# Patient Record
Sex: Female | Born: 1965 | Race: White | Hispanic: No | Smoking: Current every day smoker
Health system: Southern US, Community
[De-identification: ages and names within clinical notes are randomized; demographics above are authoritative.]

## PROBLEM LIST (undated history)

## (undated) DIAGNOSIS — N390 Urinary tract infection, site not specified: Secondary | ICD-10-CM

---

## 1997-12-22 ENCOUNTER — Emergency Department (HOSPITAL_COMMUNITY): Admission: EM | Admit: 1997-12-22 | Discharge: 1997-12-22 | Payer: Self-pay | Admitting: Emergency Medicine

## 1997-12-26 ENCOUNTER — Emergency Department (HOSPITAL_COMMUNITY): Admission: EM | Admit: 1997-12-26 | Discharge: 1997-12-26 | Payer: Self-pay | Admitting: Emergency Medicine

## 1998-04-21 ENCOUNTER — Emergency Department (HOSPITAL_COMMUNITY): Admission: EM | Admit: 1998-04-21 | Discharge: 1998-04-21 | Payer: Self-pay | Admitting: Emergency Medicine

## 1998-12-20 ENCOUNTER — Other Ambulatory Visit: Admission: RE | Admit: 1998-12-20 | Discharge: 1998-12-20 | Payer: Self-pay | Admitting: Orthopedic Surgery

## 2000-08-20 ENCOUNTER — Encounter: Payer: Self-pay | Admitting: Family Medicine

## 2000-08-20 ENCOUNTER — Encounter: Admission: RE | Admit: 2000-08-20 | Discharge: 2000-08-20 | Payer: Self-pay | Admitting: Family Medicine

## 2000-09-06 ENCOUNTER — Encounter: Admission: RE | Admit: 2000-09-06 | Discharge: 2000-09-06 | Payer: Self-pay | Admitting: Family Medicine

## 2000-09-21 ENCOUNTER — Encounter: Payer: Self-pay | Admitting: Family Medicine

## 2000-09-21 ENCOUNTER — Encounter: Admission: RE | Admit: 2000-09-21 | Discharge: 2000-09-21 | Payer: Self-pay | Admitting: Family Medicine

## 2001-03-23 ENCOUNTER — Inpatient Hospital Stay (HOSPITAL_COMMUNITY): Admission: AD | Admit: 2001-03-23 | Discharge: 2001-03-23 | Payer: Self-pay | Admitting: *Deleted

## 2001-03-23 ENCOUNTER — Encounter: Payer: Self-pay | Admitting: Obstetrics and Gynecology

## 2001-03-25 ENCOUNTER — Inpatient Hospital Stay (HOSPITAL_COMMUNITY): Admission: AD | Admit: 2001-03-25 | Discharge: 2001-03-25 | Payer: Self-pay | Admitting: Obstetrics and Gynecology

## 2001-06-26 ENCOUNTER — Ambulatory Visit (HOSPITAL_COMMUNITY): Admission: RE | Admit: 2001-06-26 | Discharge: 2001-06-26 | Payer: Self-pay | Admitting: Obstetrics and Gynecology

## 2001-06-26 ENCOUNTER — Encounter: Payer: Self-pay | Admitting: Obstetrics and Gynecology

## 2001-09-07 ENCOUNTER — Inpatient Hospital Stay (HOSPITAL_COMMUNITY): Admission: AD | Admit: 2001-09-07 | Discharge: 2001-09-07 | Payer: Self-pay | Admitting: Obstetrics and Gynecology

## 2001-09-09 ENCOUNTER — Inpatient Hospital Stay (HOSPITAL_COMMUNITY): Admission: AD | Admit: 2001-09-09 | Discharge: 2001-09-09 | Payer: Self-pay | Admitting: Obstetrics and Gynecology

## 2001-09-11 ENCOUNTER — Inpatient Hospital Stay (HOSPITAL_COMMUNITY): Admission: AD | Admit: 2001-09-11 | Discharge: 2001-09-11 | Payer: Self-pay | Admitting: Obstetrics and Gynecology

## 2001-10-04 ENCOUNTER — Inpatient Hospital Stay (HOSPITAL_COMMUNITY): Admission: AD | Admit: 2001-10-04 | Discharge: 2001-10-04 | Payer: Self-pay | Admitting: Obstetrics and Gynecology

## 2001-10-05 ENCOUNTER — Inpatient Hospital Stay (HOSPITAL_COMMUNITY): Admission: AD | Admit: 2001-10-05 | Discharge: 2001-10-05 | Payer: Self-pay | Admitting: Obstetrics and Gynecology

## 2001-10-07 ENCOUNTER — Inpatient Hospital Stay (HOSPITAL_COMMUNITY): Admission: AD | Admit: 2001-10-07 | Discharge: 2001-10-07 | Payer: Self-pay | Admitting: Obstetrics and Gynecology

## 2001-11-18 ENCOUNTER — Inpatient Hospital Stay (HOSPITAL_COMMUNITY): Admission: AD | Admit: 2001-11-18 | Discharge: 2001-11-20 | Payer: Self-pay | Admitting: Obstetrics and Gynecology

## 2001-11-21 ENCOUNTER — Encounter: Admission: RE | Admit: 2001-11-21 | Discharge: 2001-12-21 | Payer: Self-pay | Admitting: Obstetrics and Gynecology

## 2001-12-26 ENCOUNTER — Ambulatory Visit (HOSPITAL_COMMUNITY): Admission: RE | Admit: 2001-12-26 | Discharge: 2001-12-26 | Payer: Self-pay | Admitting: Obstetrics and Gynecology

## 2002-01-21 ENCOUNTER — Encounter: Admission: RE | Admit: 2002-01-21 | Discharge: 2002-02-20 | Payer: Self-pay | Admitting: Obstetrics and Gynecology

## 2002-02-21 ENCOUNTER — Encounter: Admission: RE | Admit: 2002-02-21 | Discharge: 2002-03-23 | Payer: Self-pay | Admitting: Obstetrics and Gynecology

## 2002-04-23 ENCOUNTER — Encounter: Admission: RE | Admit: 2002-04-23 | Discharge: 2002-05-23 | Payer: Self-pay | Admitting: Obstetrics and Gynecology

## 2002-06-23 ENCOUNTER — Encounter: Admission: RE | Admit: 2002-06-23 | Discharge: 2002-07-23 | Payer: Self-pay | Admitting: Obstetrics and Gynecology

## 2002-07-24 ENCOUNTER — Encounter: Admission: RE | Admit: 2002-07-24 | Discharge: 2002-08-23 | Payer: Self-pay | Admitting: Obstetrics and Gynecology

## 2002-09-22 ENCOUNTER — Encounter: Admission: RE | Admit: 2002-09-22 | Discharge: 2002-10-22 | Payer: Self-pay | Admitting: Obstetrics and Gynecology

## 2002-11-22 ENCOUNTER — Encounter: Admission: RE | Admit: 2002-11-22 | Discharge: 2002-12-22 | Payer: Self-pay | Admitting: Obstetrics and Gynecology

## 2003-12-12 ENCOUNTER — Emergency Department (HOSPITAL_COMMUNITY): Admission: EM | Admit: 2003-12-12 | Discharge: 2003-12-12 | Payer: Self-pay | Admitting: Emergency Medicine

## 2005-01-10 ENCOUNTER — Ambulatory Visit: Payer: Self-pay | Admitting: Gastroenterology

## 2005-01-30 ENCOUNTER — Ambulatory Visit: Payer: Self-pay | Admitting: Gastroenterology

## 2005-02-27 ENCOUNTER — Ambulatory Visit: Payer: Self-pay | Admitting: Gastroenterology

## 2005-03-06 ENCOUNTER — Ambulatory Visit (HOSPITAL_COMMUNITY): Admission: RE | Admit: 2005-03-06 | Discharge: 2005-03-06 | Payer: Self-pay | Admitting: Gastroenterology

## 2005-03-13 ENCOUNTER — Ambulatory Visit (HOSPITAL_COMMUNITY): Admission: RE | Admit: 2005-03-13 | Discharge: 2005-03-13 | Payer: Self-pay | Admitting: Gastroenterology

## 2006-12-31 ENCOUNTER — Ambulatory Visit: Payer: Self-pay | Admitting: Gastroenterology

## 2007-06-18 ENCOUNTER — Emergency Department (HOSPITAL_COMMUNITY): Admission: EM | Admit: 2007-06-18 | Discharge: 2007-06-18 | Payer: Self-pay | Admitting: Family Medicine

## 2007-09-17 DIAGNOSIS — F32A Depression, unspecified: Secondary | ICD-10-CM | POA: Insufficient documentation

## 2007-09-17 DIAGNOSIS — R1013 Epigastric pain: Secondary | ICD-10-CM | POA: Insufficient documentation

## 2007-09-17 DIAGNOSIS — K9289 Other specified diseases of the digestive system: Secondary | ICD-10-CM | POA: Insufficient documentation

## 2007-09-17 DIAGNOSIS — F329 Major depressive disorder, single episode, unspecified: Secondary | ICD-10-CM

## 2007-09-17 DIAGNOSIS — F411 Generalized anxiety disorder: Secondary | ICD-10-CM | POA: Insufficient documentation

## 2007-09-17 DIAGNOSIS — K3189 Other diseases of stomach and duodenum: Secondary | ICD-10-CM

## 2007-09-17 DIAGNOSIS — F410 Panic disorder [episodic paroxysmal anxiety] without agoraphobia: Secondary | ICD-10-CM | POA: Insufficient documentation

## 2008-04-21 ENCOUNTER — Emergency Department (HOSPITAL_COMMUNITY): Admission: EM | Admit: 2008-04-21 | Discharge: 2008-04-21 | Payer: Self-pay | Admitting: Emergency Medicine

## 2008-11-02 ENCOUNTER — Emergency Department (HOSPITAL_COMMUNITY): Admission: EM | Admit: 2008-11-02 | Discharge: 2008-11-02 | Payer: Self-pay | Admitting: Family Medicine

## 2010-09-13 LAB — POCT URINALYSIS DIP (DEVICE)
Hgb urine dipstick: NEGATIVE
Urobilinogen, UA: 0.2 mg/dL (ref 0.0–1.0)

## 2010-09-13 LAB — URINE CULTURE

## 2010-10-18 NOTE — Assessment & Plan Note (Signed)
[facility]                         GASTROENTEROLOGY OFFICE NOTE   NAME:BRYANTShamecca, Whitebread                       MRN:          [id number]  DATE:12/31/2006                            DOB:          1965-08-13    Mrs. Hines returns on referral from [facility] for  worsening problems with periumbilical abdominal discomfort, belching,  nausea and alternating diarrhea and constipation.  Her symptoms are  often precipitated by meals.  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  Her prior workup  included an abdominal ultrasound, a gastric emptying scan and an upper  endoscopy and all were normal.  She recently had a repeat abdominal  ultrasound at [facility] on June 26, which was normal.  A  CBC, basic metabolic panel, liver function tests, TSH, amylase and  lipase were all normal on November 26, 2006.  She states she has been under  significant stress and she has ongoing problems with anxiety and panic  attacks.  She notes her gastrointestinal symptoms clearly worsen when  she is under more stress.  She has had some relief of her nausea with  Promethazine.  She does not feel that Nexium has been effective for any  of her symptoms.   CURRENT MEDICATIONS:  Listed on the chart, updated and reviewed.   MEDICATION ALLERGIES:  AMOXICILLIN.   PHYSICAL EXAMINATION:  In no acute distress.  Weight 137.2 pounds.  Blood pressure is 104/60, pulse 80 and regular.  CHEST:  Clear to auscultation bilaterally.  CARDIAC:  Regular rate and rhythm without murmurs.  ABDOMEN:  Soft with minimal periumbilical tenderness to deep palpation,  but no rebound or guarding, no palpable organomegaly, masses or hernias,  normoactive bowel sounds.   ASSESSMENT AND PLAN:  Suspected irritable bowel syndrome and functional  dyspepsia.  She may have a component of aerophagia leading to increased  belching.  She will return  to [facility] for further management of  her anxiety, which appears to be triggering the majority of her  gastrointestinal complaints. Begin Robinul Forte one p.o. b.i.d.  Continue Nexium 40 mg p.o. q.a.m. and Promethazine one-half tablet q.6  h. p.r.n. nausea.     Venita Lick. Russella Dar, MD, [facility]  Electronically Signed    MTS/MedQ  DD: 12/31/2006  DT: 01/01/2007  Job #: [id number]   cc:   [facility], [address] [place]  [place], [place], [address]
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
## 2010-10-21 NOTE — Op Note (Signed)
[facility]  Patient:    Ana Morgan, Ana Morgan Visit Number: [id number] MRN: [id number]          Service Type: DSU Location: [facility] Attending Physician:  Leonard Schwartz Dictated by:   Janine Limbo, M.D. Proc. Date: 12/26/01 Admit Date:  12/26/2001 Discharge Date: 12/26/2001                             Operative Report  PREOPERATIVE DIAGNOSES:       Desires sterilization.  POSTOPERATIVE DIAGNOSES:      1. Desires sterilization.                               2. Omental adhesions.  PROCEDURE:                    Laparoscopic tubal cautery.  SURGEON:                      Janine Limbo, M.D.  ANESTHESIA:                   General.  DISPOSITION:                  [name] is a 45 year old female para 2-1-2-3 who desires permanent sterilization.  She understands the indications for her procedure and she accepts the risks of, but not limited to, anesthetic complications, bleeding, infections, and possible damage to the surrounding organs.  FINDINGS:                     The uterus, fallopian tubes, and the ovaries were normal.  There were mild adhesions between the omentum and the anterior uterus.  The liver appeared normal.  The bowel appeared normal.  The appendix was normal except that there were some filmy adhesions between the proximal appendix and the right pelvic side wall.  PROCEDURE:                    The patient was taken to the operating room where a general anesthetic was given.  The patients abdomen, perineum, and vagina were prepped with multiple layers of Betadine.  Examination under anesthesia was performed.  The bladder was drained of urine.  A Hulka tenaculum was placed inside the uterus.  The patient was sterilely draped. The subumbilical area was injected with 5 cc of 0.5% Marcaine with epinephrine.  An incision was made and the Veress needle was inserted.  Proper placement was confirmed using the saline drop test.   A pneumoperitoneum was then obtained.  The laparoscopic trocar and then the laparoscope were substituted for the Veress needle.  The pelvic structures were visualized with findings as mentioned above.  The left fallopian tube was identified and followed to its fimbriated end.  The proximal portion of the left fallopian tube was cauterized in several segments using electrocautery.  An identical procedure was carried out on the opposite side.  Hemostasis was adequate bilaterally.  The bowel was carefully inspected and there was no evidence of trocar damage.  The pneumoperitoneum was allowed to escape.  The instruments were removed.  The incision was closed using deep and superficial sutures of 3-0 Vicryl.  Sponge, needle, and instrument counts were correct on two occasions.  The estimated blood loss was less than 5 cc.  The patient tolerated her  procedure well.  She was taken to the recovery room in stable condition.  FOLLOWUP INSTRUCTIONS:        The patient was given a prescription for Vicodin and she will take one to two q.4h. as needed for pain.  She will call for questions or concerns.  She was given a copy of the postoperative instruction sheet as prepared by the [facility] for patients who have undergone laparoscopy.  She will have a return visit in two to three weeks. Dictated by:   Janine Limbo, M.D. Attending Physician:  Leonard Schwartz DD:  12/26/01 TD:  12/30/01 Job: [id number]

## 2011-09-28 ENCOUNTER — Emergency Department (HOSPITAL_COMMUNITY): Payer: Self-pay

## 2011-09-28 ENCOUNTER — Emergency Department (HOSPITAL_COMMUNITY)
Admission: EM | Admit: 2011-09-28 | Discharge: 2011-09-28 | Disposition: A | Discharge status: Home / Self Care | Payer: Self-pay | Attending: Emergency Medicine | Admitting: Emergency Medicine

## 2011-09-28 ENCOUNTER — Encounter (HOSPITAL_COMMUNITY): Payer: Self-pay | Admitting: Emergency Medicine

## 2011-09-28 DIAGNOSIS — R079 Chest pain, unspecified: Secondary | ICD-10-CM | POA: Insufficient documentation

## 2011-09-28 DIAGNOSIS — S20219A Contusion of unspecified front wall of thorax, initial encounter: Secondary | ICD-10-CM | POA: Insufficient documentation

## 2011-09-28 DIAGNOSIS — X58XXXA Exposure to other specified factors, initial encounter: Secondary | ICD-10-CM | POA: Insufficient documentation

## 2011-09-28 DIAGNOSIS — F17200 Nicotine dependence, unspecified, uncomplicated: Secondary | ICD-10-CM | POA: Insufficient documentation

## 2011-09-28 MED ORDER — BACLOFEN 10 MG PO TABS: 10.0000 mg | ORAL_TABLET | Freq: Three times a day (TID) | ORAL | Status: DC

## 2011-09-28 MED ORDER — MELOXICAM 7.5 MG PO TABS: ORAL_TABLET | ORAL | Status: DC

## 2011-09-28 MED ORDER — CODEINE SULFATE 30 MG PO TABS: 30.0000 mg | ORAL_TABLET | Freq: Four times a day (QID) | ORAL | Status: AC | PRN

## 2011-09-28 MED ORDER — ONDANSETRON HCL 4 MG PO TABS
4.0000 mg | ORAL_TABLET | Freq: Once | ORAL | Status: DC
  Filled 2011-09-28: qty 1

## 2011-09-28 MED ORDER — KETOROLAC TROMETHAMINE 10 MG PO TABS
10.0000 mg | ORAL_TABLET | Freq: Once | ORAL | Status: DC
  Filled 2011-09-28: qty 1

## 2011-09-28 MED ORDER — CYCLOBENZAPRINE HCL 10 MG PO TABS
10.0000 mg | ORAL_TABLET | Freq: Once | ORAL | Status: DC
  Filled 2011-09-28: qty 1
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
## 2011-09-28 NOTE — ED Notes (Signed)
Pt DC to home with steady gait 

## 2011-09-30 NOTE — ED Provider Notes (Signed)
Medical screening examination/treatment/procedure(s) were performed by non-physician practitioner and as supervising physician I was immediately available for consultation/collaboration.  Talana Slatten, MD 09/30/11 1143 

## 2011-10-22 ENCOUNTER — Emergency Department (HOSPITAL_COMMUNITY): Payer: Self-pay

## 2011-10-22 ENCOUNTER — Emergency Department (HOSPITAL_COMMUNITY)
Admission: EM | Admit: 2011-10-22 | Discharge: 2011-10-22 | Disposition: A | Discharge status: Home / Self Care | Payer: Self-pay | Attending: Emergency Medicine | Admitting: Emergency Medicine

## 2011-10-22 ENCOUNTER — Encounter (HOSPITAL_COMMUNITY): Payer: Self-pay

## 2011-10-22 DIAGNOSIS — Y92009 Unspecified place in unspecified non-institutional (private) residence as the place of occurrence of the external cause: Secondary | ICD-10-CM | POA: Insufficient documentation

## 2011-10-22 DIAGNOSIS — S91309A Unspecified open wound, unspecified foot, initial encounter: Secondary | ICD-10-CM | POA: Insufficient documentation

## 2011-10-22 DIAGNOSIS — W269XXA Contact with unspecified sharp object(s), initial encounter: Secondary | ICD-10-CM | POA: Insufficient documentation

## 2011-10-22 DIAGNOSIS — T148XXA Other injury of unspecified body region, initial encounter: Secondary | ICD-10-CM

## 2011-10-22 MED ORDER — CEPHALEXIN 500 MG PO CAPS: 500.0000 mg | ORAL_CAPSULE | Freq: Four times a day (QID) | ORAL | Status: AC

## 2011-10-22 MED ORDER — TRAMADOL HCL 50 MG PO TABS: 50.0000 mg | ORAL_TABLET | Freq: Four times a day (QID) | ORAL | Status: AC | PRN
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
## 2011-10-22 NOTE — ED Provider Notes (Signed)
History     CSN: [id number]  Arrival date & time 10/22/11  1839   First MD Initiated Contact with Patient 10/22/11 2041      Chief Complaint  Patient presents with  . Foot Injury    (Consider location/radiation/quality/duration/timing/severity/associated sxs/prior treatment) Patient is a 46 y.o. female presenting with foot injury. The history is provided by the patient.  Foot Injury  The incident occurred more than 2 days ago. The incident occurred at home. Pertinent negatives include no numbness.  Pt states she was walking barefoot through the house, and thinks she stepped on something. Saw a hole in her foot and pain. Did not see anything on the ground she may have stepped on. Has been cleaning it at home, ibuprofen. No improvement. This happened 5 days ago.   History reviewed. No pertinent past medical history.  Past Surgical History  Procedure Date  . Cesarean section     No family history on file.  History  Substance Use Topics  . Smoking status: Current Everyday Smoker -- 1.0 packs/day  . Smokeless tobacco: Not on file  . Alcohol Use: Yes     Occasionally    OB History    Grav Para Term Preterm Abortions TAB SAB Ect Mult Living                  Review of Systems  Constitutional: Negative for fever and chills.  Respiratory: Negative.   Cardiovascular: Negative.   Musculoskeletal: Positive for arthralgias.  Skin: Positive for wound. Negative for color change and rash.  Neurological: Negative for weakness and numbness.    Allergies  Amoxicillin and Hydrocodone  Home Medications   Current Outpatient Rx  Name Route Sig Dispense Refill  . IBUPROFEN 200 MG PO TABS Oral Take 200 mg by mouth every 6 (six) hours as needed. Pain    . VITAMIN B-12 100 MCG PO TABS Oral Take 50 mcg by mouth daily.      BP 117/67  Pulse 75  Temp(Src) 98 F (36.7 C) (Oral)  Resp 18  SpO2 100%  Physical Exam  Nursing note and vitals reviewed. Constitutional: She is  oriented to person, place, and time. She appears well-developed and well-nourished. No distress.  Eyes: Conjunctivae are normal.  Cardiovascular: Normal rate, regular rhythm and normal heart sounds.   Pulmonary/Chest: Effort normal and breath sounds normal. No respiratory distress. She has no wheezes. She has no rales.  Musculoskeletal:       Puncture mark to the right heel. Tender. Does not appear to be swollen, erythemous. No surrounding edema or erythema  Neurological: She is alert and oriented to person, place, and time.  Skin: Skin is warm and dry.  Psychiatric: She has a normal mood and affect.    ED Course  Procedures (including critical care time)    Dg Foot 2 Views Right  10/22/2011  *RADIOLOGY REPORT*  Clinical Data: Fall, heel pain  RIGHT FOOT - 2 VIEW  Comparison: None.  Findings: No fracture  or dislocation of midfoot or forefoot.  The phalanges are normal.  The calcaneus is normal.  No soft tissue abnormality.  A plantar calcaneal spurring.  IMPRESSION: No acute osseous abnormality.  Original Report Authenticated By: Genevive Bi, M.D.    X-ray obtained to rule out radiopaque foreign body and it is negative. There is no current signs of infection but very tender. Will start on antibiotic prophylactically and follow up as needed.    1. Puncture wound  MDM          Lottie Mussel, PA 10/23/11 [phone]

## 2011-10-22 NOTE — Discharge Instructions (Signed)
Your x-ray did not show a foreign body that would show up on an x-ray. There is a possiblity that there may be something in your heel that would not show upon the x-ray. Try taking antibiotic as prescribed utnil all gone. Ibuprofen for pain. Ultram for severe pain. Get some Calluce donuts to apply to that area. Follow up with primary care doctor or foot specialist or orthopedics if not improving.   Puncture Wound A puncture wound is an injury that extends through all layers of the skin and into the tissue beneath the skin (subcutaneous tissue). Puncture wounds become infected easily because germs often enter the body and go beneath the skin during the injury. Having a deep wound with a small entrance point makes it difficult for your caregiver to adequately clean the wound. This is especially true if you have stepped on a nail and it has passed through a dirty shoe or other situations where the wound is obviously contaminated. CAUSES  Many puncture wounds involve glass, nails, splinters, fish hooks, or other objects that enter the skin (foreign bodies). A puncture wound may also be caused by a human bite or animal bite. DIAGNOSIS  A puncture wound is usually diagnosed by your history and a physical exam. You may need to have an X-ray or an ultrasound to check for any foreign bodies still in the wound. TREATMENT   Your caregiver will clean the wound as thoroughly as possible. Depending on the location of the wound, a bandage (dressing) may be applied.   Your caregiver might prescribe antibiotic medicines.   You may need a follow-up visit to check on your wound. Follow all instructions as directed by your caregiver.  HOME CARE INSTRUCTIONS   Change your dressing once per day, or as directed by your caregiver. If the dressing sticks, it may be removed by soaking the area in water.   If your caregiver has given you follow-up instructions, it is very important that you return for a follow-up  appointment. Not following up as directed could result in a chronic or permanent injury, pain, and disability.   Only take over-the-counter or prescription medicines for pain, discomfort, or fever as directed by your caregiver.   If you are given antibiotics, take them as directed. Finish them even if you start to feel better.  You may need a tetanus shot if:  You cannot remember when you had your last tetanus shot.   You have never had a tetanus shot.  If you got a tetanus shot, your arm may swell, get red, and feel warm to the touch. This is common and not a problem. If you need a tetanus shot and you choose not to have one, there is a rare chance of getting tetanus. Sickness from tetanus can be serious. You may need a rabies shot if an animal bite caused your puncture wound. SEEK MEDICAL CARE IF:   You have redness, swelling, or increasing pain in the wound.   You have red streaks going away from the wound.   You notice a bad smell coming from the wound or dressing.   You have yellowish-white fluid (pus) coming from the wound.   You are treated with an antibiotic for infection, but the infection is not getting better.   You notice something in the wound, such as rubber from your shoe, cloth, or another object.   You have a fever.   You have severe pain.   You have difficulty breathing.   You  feel dizzy or faint.   You cannot stop vomiting.   You lose feeling, develop numbness, or cannot move a limb below the wound.   Your symptoms worsen.  MAKE SURE YOU:  Understand these instructions.   Will watch your condition.   Will get help right away if you are not doing well or get worse.  Document Released: 03/01/2005 Document Revised: 05/11/2011 Document Reviewed: 11/08/2010 [facility] Patient Information 2012 [place], [place].

## 2011-10-22 NOTE — ED Notes (Signed)
Pt alert, nad, c/o right foot pain, onset several days ago, pt states "i stepped on something in the bedroom", unknown object, pt has pin sized puncture wound to heal of foot, no drainage, area tender, ambulates to triage

## 2011-10-23 NOTE — ED Provider Notes (Signed)
Medical screening examination/treatment/procedure(s) were performed by non-physician practitioner and as supervising physician I was immediately available for consultation/collaboration.  Tennyson Kallen R Tarryn Bogdan, MD 10/23/11 2337 

## 2012-12-24 ENCOUNTER — Other Ambulatory Visit (HOSPITAL_COMMUNITY): Payer: Self-pay | Admitting: Nurse Practitioner

## 2012-12-24 DIAGNOSIS — Z139 Encounter for screening, unspecified: Secondary | ICD-10-CM

## 2012-12-30 ENCOUNTER — Ambulatory Visit (HOSPITAL_COMMUNITY)
Admission: RE | Admit: 2012-12-30 | Discharge: 2012-12-30 | Disposition: A | Discharge status: Home / Self Care | Payer: Self-pay | Source: Ambulatory Visit | Attending: Nurse Practitioner | Admitting: Nurse Practitioner

## 2012-12-30 DIAGNOSIS — Z139 Encounter for screening, unspecified: Secondary | ICD-10-CM

## 2013-04-03 ENCOUNTER — Encounter (HOSPITAL_COMMUNITY): Payer: Self-pay | Admitting: Emergency Medicine

## 2013-04-03 ENCOUNTER — Emergency Department (HOSPITAL_COMMUNITY)
Admission: EM | Admit: 2013-04-03 | Discharge: 2013-04-03 | Disposition: A | Discharge status: Home / Self Care | Payer: Self-pay | Attending: Emergency Medicine | Admitting: Emergency Medicine

## 2013-04-03 DIAGNOSIS — H9209 Otalgia, unspecified ear: Secondary | ICD-10-CM | POA: Insufficient documentation

## 2013-04-03 DIAGNOSIS — F17200 Nicotine dependence, unspecified, uncomplicated: Secondary | ICD-10-CM | POA: Insufficient documentation

## 2013-04-03 DIAGNOSIS — K120 Recurrent oral aphthae: Secondary | ICD-10-CM | POA: Insufficient documentation

## 2013-04-03 DIAGNOSIS — K029 Dental caries, unspecified: Secondary | ICD-10-CM | POA: Insufficient documentation

## 2013-04-03 DIAGNOSIS — K089 Disorder of teeth and supporting structures, unspecified: Secondary | ICD-10-CM | POA: Insufficient documentation

## 2013-04-03 DIAGNOSIS — Z880 Allergy status to penicillin: Secondary | ICD-10-CM | POA: Insufficient documentation

## 2013-04-03 DIAGNOSIS — R51 Headache: Secondary | ICD-10-CM | POA: Insufficient documentation

## 2013-04-03 MED ORDER — CLINDAMYCIN HCL 150 MG PO CAPS: 450.0000 mg | ORAL_CAPSULE | Freq: Three times a day (TID) | ORAL | Status: DC

## 2013-04-03 MED ORDER — OXYCODONE-ACETAMINOPHEN 5-325 MG PO TABS: 1.0000 | ORAL_TABLET | ORAL | Status: DC | PRN

## 2013-04-03 NOTE — ED Notes (Signed)
Pt. reports dentalgia at left upper molar onset today unrelieved by OTC pain medications .

## 2013-04-03 NOTE — ED Provider Notes (Signed)
CSN: [id number]     Arrival date & time 04/03/13  1940 History  This chart was scribed for non-physician practitioner Dierdre Forth, PA, working with Flint Melter, MD by Ronal Fear, ED scribe. This patient was seen in room TR06C/TR06C and the patient's care was started at 9:19 PM.    Chief Complaint  Patient presents with  . Dental Pain    HPI  HPI Comments: SYDELL PROWELL is a 47 y.o. female who presents to the Emergency Department complaining of sudden onset sharp pain in upper maxilla that radiates to her left eye, ear and head at 2:30pm his afternoon. Pt states that mouth wash did help the pain but she used Orajel with no relief. Pt denies nausea, or vomiting, congestions, fevers, and chills. Pt also complaining of abscess an spots.  She reports long-standing history of poor dentition. She's not taken a dentist in many years.  History reviewed. No pertinent past medical history. Past Surgical History  Procedure Laterality Date  . Cesarean section     No family history on file. History  Substance Use Topics  . Smoking status: Current Every Day Smoker -- 1.00 packs/day  . Smokeless tobacco: Not on file  . Alcohol Use: Yes     Comment: Occasionally   OB History   Grav Para Term Preterm Abortions TAB SAB Ect Mult Living                 Review of Systems  Constitutional: Negative for fever, chills and appetite change.  HENT: Positive for dental problem and ear pain. Negative for congestion, drooling, facial swelling, nosebleeds, postnasal drip, rhinorrhea, sinus pressure, sore throat and trouble swallowing.   Eyes: Negative for pain and redness.  Respiratory: Negative for cough and wheezing.   Cardiovascular: Negative for chest pain.  Gastrointestinal: Negative for nausea, vomiting and abdominal pain.  Musculoskeletal: Negative for neck pain and neck stiffness.  Skin: Negative for color change and rash.  Neurological: Positive for headaches. Negative for weakness and  light-headedness.  All other systems reviewed and are negative.    Allergies  Amoxicillin; Penicillins; and Hydrocodone  Home Medications   Current Outpatient Rx  Name  Route  Sig  Dispense  Refill  . ibuprofen (ADVIL,MOTRIN) 200 MG tablet   Oral   Take 200 mg by mouth every 6 (six) hours as needed. Pain         . vitamin B-12 (CYANOCOBALAMIN) 100 MCG tablet   Oral   Take 50 mcg by mouth daily.         . clindamycin (CLEOCIN) 150 MG capsule   Oral   Take 3 capsules (450 mg total) by mouth 3 (three) times daily.   90 capsule   0   . oxyCODONE-acetaminophen (PERCOCET/ROXICET) 5-325 MG per tablet   Oral   Take 1 tablet by mouth every 4 (four) hours as needed for pain.   11 tablet   0    BP 145/78  Pulse 80  Temp(Src) 98.3 F (36.8 C) (Oral)  Resp 20  SpO2 100% Physical Exam  Nursing note and vitals reviewed. Constitutional: She appears well-developed and well-nourished.  HENT:  Head: Normocephalic.  Right Ear: Tympanic membrane, external ear and ear canal normal.  Left Ear: Tympanic membrane, external ear and ear canal normal.  Nose: Nose normal. Right sinus exhibits no maxillary sinus tenderness and no frontal sinus tenderness. Left sinus exhibits no maxillary sinus tenderness and no frontal sinus tenderness.  Mouth/Throat: Uvula is midline, oropharynx  is clear and moist and mucous membranes are normal. No oral lesions. Abnormal dentition. Dental caries present. No uvula swelling or lacerations. No oropharyngeal exudate, posterior oropharyngeal edema, posterior oropharyngeal erythema or tonsillar abscesses.  Teeth #14-16 have been removed, tooth #13 with significant dental caries and mild erythema of the gumline. Small, ulcerated canker sore noted on the gumline at the site where tooth #14 used to be Dental caries throughout  Eyes: Conjunctivae are normal. Pupils are equal, round, and reactive to light. Right eye exhibits no discharge. Left eye exhibits no  discharge.  Neck: Normal range of motion. Neck supple.  Cardiovascular: Normal rate, regular rhythm and normal heart sounds.   Pulmonary/Chest: Effort normal and breath sounds normal. No respiratory distress. She has no wheezes.  Abdominal: Soft. Bowel sounds are normal. She exhibits no distension. There is no tenderness.  Lymphadenopathy:    She has no cervical adenopathy.  Neurological: She is alert.  Skin: Skin is warm and dry.  Psychiatric: She has a normal mood and affect.    ED Course  Procedures (including critical care time) DIAGNOSTIC STUDIES: Oxygen Saturation is 100% on RA, normal by my interpretation.    COORDINATION OF CARE:    9:48 PM- Pt advised of plan for treatment including a topical of liquid benadryl and Maalox for canker sore and pt given dental referral and pt agrees.   Labs Review Labs Reviewed - No data to display Imaging Review No results found.  EKG Interpretation   None       MDM   1. Pain due to dental caries   2. Canker sores oral     Tama Gander presents with dental pain and PE consistent with canker sore. No gross abscess.  Exam unconcerning for Ludwig's angina or spread of infection.  Will treat with clindamycin and pain medicine.  Urged patient to follow-up with dentist.  Also urged patient to find a primary care physician and she was given resources for the [facility] wellness [facility].  It has been determined that no acute conditions requiring further emergency intervention are present at this time. The patient/guardian have been advised of the diagnosis and plan. We have discussed signs and symptoms that warrant return to the ED, such as changes or worsening in symptoms.   Vital signs are stable at discharge.   BP 145/78  Pulse 80  Temp(Src) 98.3 F (36.8 C) (Oral)  Resp 20  SpO2 100%  Patient/guardian has voiced understanding and agreed to follow-up with the PCP or specialist.    I personally performed the services  described in this documentation, which was scribed in my presence. The recorded information has been reviewed and is accurate.    Dahlia Client Dao Memmott, PA-C 04/03/13 2208

## 2013-04-04 NOTE — ED Provider Notes (Signed)
Medical screening examination/treatment/procedure(s) were performed by non-physician practitioner and as supervising physician I was immediately available for consultation/collaboration.  Addiel Mccardle L Demaya Hardge, MD 04/04/13 0003 

## 2013-06-03 ENCOUNTER — Encounter (HOSPITAL_COMMUNITY): Payer: Self-pay | Admitting: Emergency Medicine

## 2013-06-03 ENCOUNTER — Emergency Department (HOSPITAL_COMMUNITY): Payer: Medicaid Other

## 2013-06-03 ENCOUNTER — Emergency Department (HOSPITAL_COMMUNITY)
Admission: EM | Admit: 2013-06-03 | Discharge: 2013-06-03 | Disposition: A | Discharge status: Home / Self Care | Payer: Medicaid Other | Attending: Emergency Medicine | Admitting: Emergency Medicine

## 2013-06-03 DIAGNOSIS — Y939 Activity, unspecified: Secondary | ICD-10-CM | POA: Insufficient documentation

## 2013-06-03 DIAGNOSIS — Y92410 Unspecified street and highway as the place of occurrence of the external cause: Secondary | ICD-10-CM | POA: Insufficient documentation

## 2013-06-03 DIAGNOSIS — Z885 Allergy status to narcotic agent status: Secondary | ICD-10-CM | POA: Insufficient documentation

## 2013-06-03 DIAGNOSIS — Z881 Allergy status to other antibiotic agents status: Secondary | ICD-10-CM | POA: Insufficient documentation

## 2013-06-03 DIAGNOSIS — F17200 Nicotine dependence, unspecified, uncomplicated: Secondary | ICD-10-CM | POA: Insufficient documentation

## 2013-06-03 DIAGNOSIS — S139XXA Sprain of joints and ligaments of unspecified parts of neck, initial encounter: Secondary | ICD-10-CM | POA: Insufficient documentation

## 2013-06-03 DIAGNOSIS — S161XXA Strain of muscle, fascia and tendon at neck level, initial encounter: Secondary | ICD-10-CM

## 2013-06-03 DIAGNOSIS — Z880 Allergy status to penicillin: Secondary | ICD-10-CM | POA: Insufficient documentation
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
## 2013-06-03 NOTE — ED Notes (Signed)
Pt reports pain to back of neck pain is worse with movement to rt side.
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
## 2013-06-07 NOTE — ED Provider Notes (Signed)
Medical screening examination/treatment/procedure(s) were performed by non-physician practitioner and as supervising physician I was immediately available for consultation/collaboration.  Ana Morgan Yuritzy Zehring, MD 06/07/13 0833 

## 2014-04-13 ENCOUNTER — Ambulatory Visit (INDEPENDENT_AMBULATORY_CARE_PROVIDER_SITE_OTHER): Payer: Medicaid Other | Admitting: Family Medicine

## 2014-04-13 ENCOUNTER — Encounter: Payer: Self-pay | Admitting: Family Medicine

## 2014-04-13 VITALS — BP 118/83 | HR 67 | Temp 98.1°F | Ht 60.0 in | Wt 144.0 lb

## 2014-04-13 DIAGNOSIS — L720 Epidermal cyst: Secondary | ICD-10-CM | POA: Insufficient documentation

## 2014-04-13 DIAGNOSIS — Z1283 Encounter for screening for malignant neoplasm of skin: Secondary | ICD-10-CM

## 2014-04-13 DIAGNOSIS — F411 Generalized anxiety disorder: Secondary | ICD-10-CM

## 2014-04-13 DIAGNOSIS — Z136 Encounter for screening for cardiovascular disorders: Secondary | ICD-10-CM

## 2014-04-13 DIAGNOSIS — Z711 Person with feared health complaint in whom no diagnosis is made: Secondary | ICD-10-CM | POA: Insufficient documentation

## 2014-04-13 NOTE — Progress Notes (Signed)
Subjective:     Patient ID: Ana Morgan, female   DOB: 10/11/1965, 48 y.o.   MRN: [id number]  HPI Mrs. Ana Morgan is a 48yo female presenting today to establish care. -Acute complaints today include anxiety and skin lesions on left arm and face - Anxiety [facility] managed by [facility] and [facility]. States she has tried many different medications and that several of them had adverse effects. She has not visited these places for ~4-5years - Attempted to go to therapy in the past, but they never contacted her concerning a follow-up appointment - Has approximately one panic attack per month and manages it with slowing her respiratory rate and deep breathing - Has other episodes of chest tightness, head tightness, and decreased hearings that last approximately one minute. Occurs a few times a week. - Notes two skin lesions she would like examined today. The first is located on her left forearm. The second is located on the left side of her face beside her nose; it initially started out as red, but is now white and sunken. Both have been present for several years.   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    Cardiovascular: Negative for chest pain.  Neurological: Negative for dizziness.  Psychiatric/Behavioral: The patient is nervous/anxious.   All other systems reviewed and are negative.      Objective:   Physical Exam  Constitutional: She is oriented to person, place, and time. She appears well-developed and well-nourished. No distress.  HENT:  Head: Normocephalic and atraumatic.  Cardiovascular: Normal rate and regular rhythm.  Exam reveals no gallop and no friction rub.   No murmur heard. Pulmonary/Chest: Effort normal and breath sounds normal. No respiratory distress. She has no wheezes. She has no rales.  Abdominal: Soft. She exhibits no distension. There is no tenderness.  Musculoskeletal: She exhibits no edema.  Neurological: She is alert and oriented to person, place, and time.  Skin: She is not diaphoretic.  Cystic lesion measuring ~1cm on left forearm with indentation in center. Hypopigmented indentation on left face lateral to nose.  Psychiatric: She has a normal mood and affect. Her behavior is normal.      Assessment:     Please refer to Problem List for Assessment.    Plan:     Please refer to Problem List for Plan.

## 2014-04-13 NOTE — Assessment & Plan Note (Signed)
-   Instructed to sign Release of Information form so we can obtain records concerning previous management. Will review records once received and determine most appropriate management. - Episodes of chest tightness, head tightness, and decreased hearing believed to be manifestations of anxiety. No EKG today as unlikely to reveal changes when no experiencing an acute episode. Counseled on red flag symptoms and told to call office or 911 if she experiences any chest pain, shortness of breath, or dizziness with these episodes. - Instructed to follow up at clinic once records are sent to discuss medical management and therapy

## 2014-04-13 NOTE — Patient Instructions (Signed)
Thank you so much for coming to visit us today! Please try and sign the form before you leave so we can obtain records from [facility] and [facility]. Once we know what medications you have tried for your anxiety, we can try to start you back on a medication.    We will obtain some labs today. We will let you know the results.  I have made a referral to dermatology to check out the spots on your face and arm.  They will contact you concerning an appointment time.  Please schedule an appointment for the spot on your arm if you would like it taken out.  We will schedule you an appointment after we obtain your records so we can further discuss your anxiety!  Thanks again! Dr. Caroleen Hammanumley

## 2014-04-13 NOTE — Assessment & Plan Note (Signed)
-   Discussed monitoring vs. Excision - May have excision at clinic or at dermatology (referring to dermatology for skin lesion on face)

## 2014-04-13 NOTE — Assessment & Plan Note (Signed)
-   Lesion locate on left side of face lateral to nose - States lesion has evolved within past year from red to hypopigmented. - Will refer to dermatology due to location

## 2014-04-14 LAB — BASIC METABOLIC PANEL
BUN: 10 mg/dL (ref 6–23)
CALCIUM: 9.3 mg/dL (ref 8.4–10.5)
CHLORIDE: 104 meq/L (ref 96–112)
CO2: 28 mEq/L (ref 19–32)
CREATININE: 0.76 mg/dL (ref 0.50–1.10)
Glucose, Bld: 90 mg/dL (ref 70–99)
POTASSIUM: 3.9 meq/L (ref 3.5–5.3)
SODIUM: 141 meq/L (ref 135–145)

## 2014-04-14 LAB — CBC
HEMATOCRIT: 42.3 % (ref 36.0–46.0)
HEMOGLOBIN: 14.4 g/dL (ref 12.0–15.0)
MCH: 31.1 pg (ref 26.0–34.0)
MCHC: 34 g/dL (ref 30.0–36.0)
MCV: 91.4 fL (ref 78.0–100.0)
PLATELETS: 281 10*3/uL (ref 150–400)
RBC: 4.63 MIL/uL (ref 3.87–5.11)
RDW: 13.5 % (ref 11.5–15.5)
WBC: 9.4 10*3/uL (ref 4.0–10.5)

## 2014-04-14 LAB — LIPID PANEL
Cholesterol: 181 mg/dL (ref 0–200)
HDL: 41 mg/dL (ref >39)
LDL CALC: 107 mg/dL — AB (ref 0–99)
TRIGLYCERIDES: 166 mg/dL — AB (ref <150)
Total CHOL/HDL Ratio: 4.4 Ratio
VLDL: 33 mg/dL (ref 0–40)

## 2014-04-21 ENCOUNTER — Encounter: Payer: Self-pay | Admitting: Family Medicine

## 2014-07-19 ENCOUNTER — Emergency Department (HOSPITAL_COMMUNITY)
Admission: EM | Admit: 2014-07-19 | Discharge: 2014-07-19 | Disposition: A | Discharge status: Home / Self Care | Payer: Self-pay | Attending: Emergency Medicine | Admitting: Emergency Medicine

## 2014-07-19 ENCOUNTER — Emergency Department (HOSPITAL_COMMUNITY): Payer: Medicaid Other

## 2014-07-19 ENCOUNTER — Encounter (HOSPITAL_COMMUNITY): Payer: Self-pay | Admitting: Emergency Medicine

## 2014-07-19 DIAGNOSIS — R0789 Other chest pain: Secondary | ICD-10-CM | POA: Insufficient documentation

## 2014-07-19 DIAGNOSIS — Z880 Allergy status to penicillin: Secondary | ICD-10-CM | POA: Insufficient documentation

## 2014-07-19 DIAGNOSIS — F419 Anxiety disorder, unspecified: Secondary | ICD-10-CM | POA: Insufficient documentation

## 2014-07-19 DIAGNOSIS — Z720 Tobacco use: Secondary | ICD-10-CM | POA: Insufficient documentation

## 2014-07-19 LAB — BASIC METABOLIC PANEL
ANION GAP: 4 — AB (ref 5–15)
BUN: 11 mg/dL (ref 6–23)
CO2: 30 mmol/L (ref 19–32)
CREATININE: 0.8 mg/dL (ref 0.50–1.10)
Calcium: 9.8 mg/dL (ref 8.4–10.5)
Chloride: 105 mmol/L (ref 96–112)
GFR calc Af Amer: >90 mL/min (ref >90)
GFR calc non Af Amer: 87 mL/min — ABNORMAL LOW (ref >90)
Glucose, Bld: 104 mg/dL — ABNORMAL HIGH (ref 70–99)
Potassium: 3.7 mmol/L (ref 3.5–5.1)
Sodium: 139 mmol/L (ref 135–145)

## 2014-07-19 LAB — I-STAT TROPONIN, ED
TROPONIN I, POC: 0 ng/mL (ref 0.00–0.08)
Troponin i, poc: 0 ng/mL (ref 0.00–0.08)

## 2014-07-19 LAB — CBC
HEMATOCRIT: 43.5 % (ref 36.0–46.0)
Hemoglobin: 15.5 g/dL — ABNORMAL HIGH (ref 12.0–15.0)
MCH: 32.2 pg (ref 26.0–34.0)
MCHC: 35.6 g/dL (ref 30.0–36.0)
MCV: 90.4 fL (ref 78.0–100.0)
PLATELETS: 238 10*3/uL (ref 150–400)
RBC: 4.81 MIL/uL (ref 3.87–5.11)
RDW: 12.5 % (ref 11.5–15.5)
WBC: 7.9 10*3/uL (ref 4.0–10.5)

## 2014-07-19 MED ORDER — ALPRAZOLAM 0.5 MG PO TABS
0.5000 mg | ORAL_TABLET | Freq: Once | ORAL | Status: AC
  Administered 2014-07-19: 0.5 mg via ORAL
  Filled 2014-07-19: qty 1

## 2014-07-19 NOTE — Discharge Instructions (Signed)
Stay hydrated.   Take nexium 20 mg daily.   Follow up with your doctor. You may need stress test if you still have chest pain.   Return to ER if you have worse chest pain, shortness of breath.

## 2014-07-19 NOTE — ED Notes (Signed)
Pt c/o chest pain onset 1 1/2 hour PTA while playing cards. Pt denies any other symptoms.

## 2014-07-19 NOTE — ED Provider Notes (Signed)
CSN: [id number]     Arrival date & time 07/19/14  1730 History   First MD Initiated Contact with Patient 07/19/14 1808     Chief Complaint  Patient presents with  . Chest Pain     (Consider location/radiation/quality/duration/timing/severity/associated sxs/prior Treatment) The history is provided by the patient.  Ana Morgan is a 49 y.o. female otherwise healthy here presenting with chest pain. She was playing cards and had sudden onset of substernal chest pain. She took some Zantac, antacid and pain resolved. She is a smoker but denies history of CAD. No history of hypertension or diabetes and no family history of CAD. Denies any abdominal pain or shortness of breath or recent travel history PE.     History reviewed. No pertinent past medical history. Past Surgical History  Procedure Laterality Date  . Cesarean section     No family history on file. History  Substance Use Topics  . Smoking status: Current Every Day Smoker -- 1.00 packs/day  . Smokeless tobacco: Not on file  . Alcohol Use: Yes     Comment: Occasionally   OB History    No data available     Review of Systems  Cardiovascular: Positive for chest pain.  All other systems reviewed and are negative.     Allergies  Amoxicillin; Penicillins; and Hydrocodone  Home Medications   Prior to Admission medications   Medication Sig Start Date End Date Taking? Authorizing Provider  ibuprofen (ADVIL,MOTRIN) 200 MG tablet Take 600-800 mg by mouth daily as needed (pain).   Yes Historical Provider, MD   BP 104/67 mmHg  Pulse 72  Temp(Src) 98 F (36.7 C) (Oral)  Resp 13  Ht 5' (1.524 m)  Wt 140 lb (63.504 kg)  BMI 27.34 kg/m2  SpO2 100% Physical Exam  Constitutional: She is oriented to person, place, and time. She appears well-developed and well-nourished.  Anxious   HENT:  Head: Normocephalic.  Mouth/Throat: Oropharynx is clear and moist.  Eyes: Conjunctivae and EOM are normal. Pupils are equal, round, and  reactive to light.  Neck: Normal range of motion. Neck supple.  Cardiovascular: Normal rate, regular rhythm and normal heart sounds.   Pulmonary/Chest: Effort normal and breath sounds normal. No respiratory distress. She has no wheezes. She has no rales.  No reproducible tenderness   Abdominal: Soft. Bowel sounds are normal. She exhibits no distension. There is no tenderness. There is no rebound and no guarding.  Musculoskeletal: Normal range of motion. She exhibits no edema or tenderness.  Neurological: She is alert and oriented to person, place, and time. No cranial nerve deficit. Coordination normal.  Skin: Skin is warm and dry.  Psychiatric: She has a normal mood and affect. Her behavior is normal. Judgment and thought content normal.  Nursing note and vitals reviewed.   ED Course  Procedures (including critical care time) Labs Review Labs Reviewed  CBC - Abnormal; Notable for the following:    Hemoglobin 15.5 (*)    All other components within normal limits  BASIC METABOLIC PANEL - Abnormal; Notable for the following:    Glucose, Bld 104 (*)    GFR calc non Af Amer 87 (*)    Anion gap 4 (*)    All other components within normal limits  I-STAT TROPOININ, ED  Rosezena SensorI-STAT TROPOININ, ED    Imaging Review Dg Chest 2 View  07/19/2014   CLINICAL DATA:  Midsternal chest pain for 1 day  EXAM: CHEST  2 VIEW  COMPARISON:  September 28, 2011  FINDINGS: Lungs are clear. Heart size and pulmonary vascularity are normal. No adenopathy. No pneumothorax. No bone lesions.  IMPRESSION: No edema or consolidation.   Electronically Signed   By: Bretta Bang III M.D.   On: 07/19/2014 19:19     EKG Interpretation   Date/Time:  Sunday July 19 2014 17:37:54 EST Ventricular Rate:  83 PR Interval:  204 QRS Duration: 102 QT Interval:  364 QTC Calculation: 427 R Axis:   28 Text Interpretation:  Normal sinus rhythm Incomplete right bundle branch  block Borderline ECG incomplete RBBB new sin 1999  Confirmed by Kearia Yin  MD,  Aundrea Higginbotham ([id number]) on 07/19/2014 6:27:03 PM      MDM   Final diagnoses:  None    Ana Morgan is a 49 y.o. female here with chest pain. Had incomplete RBBB that is new but last EKG was 1999. No ischemic changes. Consider ACS but I doubt PE or dissection. Will get delta trop.   9:09 PM Labs unremarkable. Delta trop neg. CXR unremarkable. Will dc home. Likely reflux. Will have her f/u with PMD and take nexium.    Richardean Canal, MD 07/19/14 2110

## 2014-11-24 ENCOUNTER — Ambulatory Visit (INDEPENDENT_AMBULATORY_CARE_PROVIDER_SITE_OTHER): Payer: No Typology Code available for payment source | Admitting: Family Medicine

## 2014-11-24 ENCOUNTER — Encounter: Payer: Self-pay | Admitting: Family Medicine

## 2014-11-24 VITALS — BP 100/73 | HR 79 | Temp 97.5°F | Wt 144.2 lb

## 2014-11-24 DIAGNOSIS — H9313 Tinnitus, bilateral: Secondary | ICD-10-CM

## 2014-11-24 DIAGNOSIS — K2970 Gastritis, unspecified, without bleeding: Secondary | ICD-10-CM

## 2014-11-24 DIAGNOSIS — R1013 Epigastric pain: Secondary | ICD-10-CM | POA: Diagnosis not present

## 2014-11-24 LAB — POCT H PYLORI SCREEN: H Pylori Screen, POC: NEGATIVE

## 2014-11-24 MED ORDER — OMEPRAZOLE 20 MG PO CPDR: 20.0000 mg | DELAYED_RELEASE_CAPSULE | Freq: Every day | ORAL | Status: DC

## 2014-11-24 MED ORDER — RANITIDINE HCL 150 MG PO TABS: 150.0000 mg | ORAL_TABLET | Freq: Two times a day (BID) | ORAL | Status: DC

## 2014-11-24 MED ORDER — SUCRALFATE 1 G PO TABS: 1.0000 g | ORAL_TABLET | Freq: Three times a day (TID) | ORAL | Status: DC

## 2014-11-24 MED ORDER — LORATADINE 10 MG PO TABS: 10.0000 mg | ORAL_TABLET | Freq: Every day | ORAL | Status: AC

## 2014-11-24 NOTE — Patient Instructions (Signed)
Thank you for coming in, today!  For your stomach: I think you have some reflux "dyspepsia" (discomfort with eating) and "gastritis" (inflammation of the stomach). This can be due to several things -- smoking in particular can irritate the stomach, and NSAID medications like ibuprofen can irritate the stomach. H.pylori is an infection that can cause some of these symptoms but your test was negative. I want you to take two anti-acid pills that work together in different ways: 1. omeprazole once per day to block acid PRODUCTION 2. ranitidine (generic Zantac) twice per day to block acid RELEASE Try both of these for several weeks, and come back to see Dr. Caroleen Hamman after that to see what needs to change, if anything. You can keep taking PeptoBismol if it helps. You can also try a medicine called sulcralfate (generic for Carafate) -- it coats the stomach and intestines and helps reduce inflammation. I would take the other two medications first, then try sulcralfate later if you need extra help.  For your ears: This could be several things all related together. It could be mostly (or all) allergies, so I want you to take loratadine (generic for Claritin) every day. This medicine works best when taken every day, consistently. This will help with inflammation in your nose, throat, sinuses, and ears. That should help the ringing some. If it does not, come back to see Dr. Caroleen Hamman in about 4-6 weeks -- she may want to try something different or refer you to the ENT doctor.  Otherwise, come back as you need. I'll let Dr. Caroleen Hamman know what all we talked about, today. Please feel free to call with any questions or concerns at any time, at [phone]. --Dr. Casper Harrison

## 2014-11-24 NOTE — Progress Notes (Signed)
Subjective:    Patient ID: Ana Morgan, female    DOB: 07-05-1965, 49 y.o.   MRN: [id number]  HPI: Pt presents to clinic for complaint of abdominal discomfort and tinnitus / ear symptoms.  Abdominal discomfort - present for years but worse for 6-12 months - reports sensation of bloating immediately after eating but not frank pain; does report some early satiety - describes heartburn / reflux-type symptoms (burning pain in her stomach up into her chest but no belching, sour taste, or cough) - she reports no particular triggers in terms of types or amounts of food - denies frank diarrhea or constipation; does report some variation and occasionally has some loose stools but typically has at least 1 BM per day - reports hx of IBS and has had an upper GI study but thinks these symptoms are slightly different - reports PeptoBismol helps but does not take any other medications; Zantac has not helped much but she does not take it regularly - does reports varying use of ibuprofen and does smoke about a pack per day  Tinnitus - present since teenage years, but worse for the past couple of years - reports sensation of "clogging" or "stuffiness" with changes in elevation - reports decreased hearing at baseline; states she feels "like when you go up a mountain and the pressure goes up, before your ears pop" - overall feels like the left is worse than the right - does report some allergies / congestion symptoms, worse with changes of weather; does not take a regular OTC antihistamine  Review of Systems: As above.     Objective:   Physical Exam BP 100/73 mmHg  Pulse 79  Temp(Src) 97.5 F (36.4 C)  Wt 144 lb 3.2 oz (65.409 kg) Gen: well-appearing adult female in NAD HEENT: [place]/AT, EOMI, PERRLA, MMM, TM's clear bilaterally  Nasal mucosae and posterior oropharynx mildly erythematous; no tonsillar swelling Neck: supple, normal ROM, minimal shotty anterior cervical lymphadenopathy Cardio: RRR, no  murmur appreciated Pulm: CTAB, no wheezes, normal WOB Abd: soft, mild-moderate tenderness in upper periumbilical and epigastric area; BS+, normoactive Ext: warm, well-perfused, no LE edema  H.pylori POC screen: NEGATIVE     Assessment & Plan:  49yo female with:  1. Dyspepsia - suspect mixed picture of reflux-related gastritis plus NSAID use in a smoking patient - hx of IBS but current symptoms not suggestive of alternating diarrhea / constipation or frank crampy discomfort - H.pylori negative - counseled on minimizing NSAID use and reducing / stopping smoking as she is able - Rx for omeprazole 20 mg daily and Zantac 150 mg BID, for at least 4-6 weeks - continue PeptoBismol PRN if helpful - provided printed Rx for Carafate four times daily to try in addition to PPI and H2 blocker, if desired - f/u in 4-6 weeks for re-eval; if no improvement, would consider med adjustment, trial of Bentyl for IBS-related pathology, and / or GI referral  2. Tinnitus - also likely multifactorial; possible seasonal allergic component, ?chronic sinusitis, and / or eustachian tube dysfunction - Rx for Claritin 10 mg daily and counseled that it will work better with consistent daily use - counseled that this may alleviate some but possibly not all symptoms - advised trying at least 4 weeks of daily antihistamine and f/u after that; could consider referral to ENT for consideration of tympanostomy tubes in the future  Note FYI to Dr. Caroleen Hamman; advised f/u with her as above  Bobbye Morton, MD PGY-3, [facility] 11/24/2014, 11:18 AM

## 2014-12-24 ENCOUNTER — Encounter (HOSPITAL_COMMUNITY): Payer: Self-pay | Admitting: Emergency Medicine

## 2014-12-24 ENCOUNTER — Emergency Department (HOSPITAL_COMMUNITY)
Admission: EM | Admit: 2014-12-24 | Discharge: 2014-12-24 | Disposition: A | Discharge status: Home / Self Care | Payer: No Typology Code available for payment source | Source: Home / Self Care | Attending: Family Medicine | Admitting: Family Medicine

## 2014-12-24 DIAGNOSIS — N390 Urinary tract infection, site not specified: Secondary | ICD-10-CM

## 2014-12-24 HISTORY — DX: Urinary tract infection, site not specified: N39.0

## 2014-12-24 LAB — POCT URINALYSIS DIP (DEVICE)
BILIRUBIN URINE: NEGATIVE
Glucose, UA: NEGATIVE mg/dL
Hgb urine dipstick: NEGATIVE
KETONES UR: NEGATIVE mg/dL
Leukocytes, UA: NEGATIVE
Nitrite: POSITIVE — AB
PROTEIN: NEGATIVE mg/dL
SPECIFIC GRAVITY, URINE: 1.015 (ref 1.005–1.030)
Urobilinogen, UA: 0.2 mg/dL (ref 0.0–1.0)
pH: 7.5 (ref 5.0–8.0)

## 2014-12-24 MED ORDER — NITROFURANTOIN MONOHYD MACRO 100 MG PO CAPS: 100.0000 mg | ORAL_CAPSULE | Freq: Two times a day (BID) | ORAL | Status: DC

## 2014-12-24 NOTE — ED Provider Notes (Addendum)
CSN: [id number]     Arrival date & time 12/24/14  1927 History   First MD Initiated Contact with Patient 12/24/14 1946     Chief Complaint  Patient presents with  . Abdominal Pain   (Consider location/radiation/quality/duration/timing/severity/associated sxs/prior Treatment) Patient is a 49 y.o. female presenting with abdominal pain. The history is provided by the patient.  Abdominal Pain Pain location:  Suprapubic Pain quality: pressure   Pain radiates to:  LLQ Pain severity:  Mild Onset quality:  Gradual Duration:  3 days Progression:  Worsening Chronicity:  New Associated symptoms: no chills, no fever, no hematuria, no nausea, no vaginal bleeding, no vaginal discharge and no vomiting     Past Medical History  Diagnosis Date  . UTI (lower urinary tract infection)    Past Surgical History  Procedure Laterality Date  . Cesarean section     No family history on file. History  Substance Use Topics  . Smoking status: Current Every Day Smoker -- 1.00 packs/day  . Smokeless tobacco: Not on file  . Alcohol Use: Yes     Comment: Occasionally   OB History    No data available     Review of Systems  Constitutional: Negative.  Negative for fever and chills.  Gastrointestinal: Positive for abdominal pain. Negative for nausea and vomiting.  Genitourinary: Positive for urgency and frequency. Negative for hematuria, flank pain, vaginal bleeding, vaginal discharge and menstrual problem.    Allergies  Amoxicillin; Penicillins; and Hydrocodone  Home Medications   Prior to Admission medications   Medication Sig Start Date End Date Taking? Authorizing Provider  Phenazopyridine HCl (AZO TABS PO) Take by mouth. Has taken for 3 days, including today   Yes Historical Provider, MD  ibuprofen (ADVIL,MOTRIN) 200 MG tablet Take 600-800 mg by mouth daily as needed (pain).    Historical Provider, MD  loratadine (CLARITIN) 10 MG tablet Take 1 tablet (10 mg total) by mouth daily. 11/24/14    Stephanie Coup Street, MD  nitrofurantoin, macrocrystal-monohydrate, (MACROBID) 100 MG capsule Take 1 capsule (100 mg total) by mouth 2 (two) times daily. 12/24/14   Linna Hoff, MD  omeprazole (PRILOSEC) 20 MG capsule Take 1 capsule (20 mg total) by mouth daily. 11/24/14   Stephanie Coup Street, MD  ranitidine (ZANTAC) 150 MG tablet Take 1 tablet (150 mg total) by mouth 2 (two) times daily. 11/24/14   Stephanie Coup Street, MD  sucralfate (CARAFATE) 1 G tablet Take 1 tablet (1 g total) by mouth 4 (four) times daily -  with meals and at bedtime. 11/24/14   Stephanie Coup Street, MD   BP 118/72 mmHg  Pulse 83  Temp(Src) 98 F (36.7 C) (Oral)  Resp 14  SpO2 96% Physical Exam  Constitutional: She is oriented to person, place, and time. She appears well-developed and well-nourished. No distress.  Abdominal: Soft. Bowel sounds are normal. She exhibits no distension and no mass. There is tenderness. There is no rebound and no guarding.  Neurological: She is alert and oriented to person, place, and time.  Skin: Skin is warm and dry.  Nursing note and vitals reviewed.   ED Course  Procedures (including critical care time) Labs Review Labs Reviewed  POCT URINALYSIS DIP (DEVICE) - Abnormal; Notable for the following:    Nitrite POSITIVE (*)    All other components within normal limits  URINE CULTURE   U/a reviewed Imaging Review No results found.   MDM   1. UTI (lower urinary tract infection)  Linna Hoff, MD 12/24/14 2004  Linna Hoff, MD 12/24/14 2005

## 2014-12-24 NOTE — ED Notes (Signed)
C/o lower abdominal pain and lower back pain.  Left side worse than right.  Reports bm's to be regular.  No vaginal discharge.  Patient reports feeling like this before with a uti.

## 2014-12-26 LAB — URINE CULTURE: Culture: NO GROWTH

## 2014-12-28 NOTE — ED Notes (Signed)
Final report of UA culture negative 

## 2015-01-24 ENCOUNTER — Other Ambulatory Visit: Payer: Self-pay | Admitting: Family Medicine

## 2015-05-04 ENCOUNTER — Encounter: Payer: Self-pay | Admitting: Family Medicine

## 2015-05-04 ENCOUNTER — Ambulatory Visit (INDEPENDENT_AMBULATORY_CARE_PROVIDER_SITE_OTHER): Payer: No Typology Code available for payment source | Admitting: Family Medicine

## 2015-05-04 VITALS — BP 110/64 | HR 69 | Temp 98.0°F | Ht 60.0 in | Wt 143.0 lb

## 2015-05-04 DIAGNOSIS — R0981 Nasal congestion: Secondary | ICD-10-CM | POA: Diagnosis not present

## 2015-05-04 DIAGNOSIS — Z23 Encounter for immunization: Secondary | ICD-10-CM | POA: Diagnosis not present

## 2015-05-04 MED ORDER — IPRATROPIUM BROMIDE 0.03 % NA SOLN: 2.0000 | Freq: Two times a day (BID) | NASAL | Status: AC

## 2015-05-04 NOTE — Progress Notes (Signed)
   Subjective:    Patient ID: Ana Morgan, female    DOB: 05/17/1966, 49 y.o.   MRN: [id number]  Seen for Same day visit for   CC: congestion   Having sinus congestion that has failed to improve. Now having some trouble in her ears.   Has been sick for 90 days. Nasal discharge: no Medications tried: all over the counter  Sick contacts: no  Symptoms Fever: no Headache or face pain: yes Tooth pain: no Sneezing: no Scratchy throat: no Allergies: no Muscle aches: no Severe fatigue: no Stiff neck: no Shortness of breath: no Rash: no Sore throat or swollen glands: no  Does smoke tobacco.   Review of Systems   See HPI for ROS. Objective:  BP 110/64 mmHg  Pulse 69  Temp(Src) 98 F (36.7 C) (Oral)  Ht 5' (1.524 m)  Wt 143 lb (64.864 kg)  BMI 27.93 kg/m2  SpO2 99%  General: NAD HEENT: TM's clear and intact, mild cervical LAD, clear conjunctiva, EOMI, PERRL, Uvula midline, no tonsillar exudates, turbinates are erythematous  Cardiac: RRR, normal heart sounds, no murmurs.  Respiratory: CTAB, normal effort Extremities: WWP. Skin: warm and dry, no rashes noted Neuro: alert and oriented, no focal deficits     Assessment & Plan:   Nose congestion Most likely related to viral origin.  No suggestion of bacterial.  - atrovent nasal spray  - she has been using afrin but unsure of how long. May have been contributing to her symptoms  - supportive care: honey, netti pot, nasal saline.  - if there is no improvement in her symptoms in 7-10 days may need to consider antibiotics vs CT sinus vs ENT referral.

## 2015-05-04 NOTE — Patient Instructions (Signed)
Thank you for coming in,   Please try the Atrovent spray   You can also try Afrin but can only be used for 3 days or it starts having the opposite effect.   You can also pick up zyrtec-D or Claritin over the counter.   You can also use a humidifier and honey which helps with your symptoms.   If you don't have any improvement in 7-10 days then please return.   Sign up for My Chart to have easy access to your labs results, and communication with your Primary care physician   Please feel free to call with any questions or concerns at any time, at [phone]. --Dr. Jordan LikesSchmitz

## 2015-05-04 NOTE — Assessment & Plan Note (Signed)
Most likely related to viral origin.  No suggestion of bacterial.  - atrovent nasal spray  - she has been using afrin but unsure of how long. May have been contributing to her symptoms  - supportive care: honey, netti pot, nasal saline.  - if there is no improvement in her symptoms in 7-10 days may need to consider antibiotics vs CT sinus vs ENT referral.

## 2015-05-05 ENCOUNTER — Other Ambulatory Visit: Payer: Self-pay | Admitting: Family Medicine

## 2015-06-08 ENCOUNTER — Ambulatory Visit (INDEPENDENT_AMBULATORY_CARE_PROVIDER_SITE_OTHER): Payer: BLUE CROSS/BLUE SHIELD | Admitting: Family Medicine

## 2015-06-08 ENCOUNTER — Encounter: Payer: Self-pay | Admitting: Family Medicine

## 2015-06-08 VITALS — BP 123/77 | HR 77 | Temp 97.9°F | Ht 60.0 in | Wt 145.6 lb

## 2015-06-08 DIAGNOSIS — R0981 Nasal congestion: Secondary | ICD-10-CM

## 2015-06-08 DIAGNOSIS — J321 Chronic frontal sinusitis: Secondary | ICD-10-CM | POA: Diagnosis not present

## 2015-06-08 MED ORDER — AZITHROMYCIN 250 MG PO TABS: ORAL_TABLET | ORAL | Status: DC

## 2015-06-08 NOTE — Patient Instructions (Signed)
Thank you so much for coming to visit me today! I have sent in a prescription for antibiotics for you to take. Please take two tablets today and then one tablet daily to complete a five day course. I have also placed a referral to ENT. They should contact you with an appointment. Please sign a release of information form when you leave so we can obtain notes from your last practice concerning prior medications.  Thanks again! Dr. Caroleen Hammanumley

## 2015-06-09 MED ORDER — FLUCONAZOLE 150 MG PO TABS: 150.0000 mg | ORAL_TABLET | Freq: Once | ORAL | Status: DC

## 2015-06-10 NOTE — Assessment & Plan Note (Addendum)
-   Suspected to be contributing to otic symptoms - Course of Azithromycin given - Refer to ENT given protracted course - Follow up as needed

## 2015-06-10 NOTE — Progress Notes (Signed)
Subjective:     Patient ID: Ana Morgan, female   DOB: 05/28/1966, 50 y.o.   MRN: [id number]  HPI [name] is a 50yo female presenting today for follow up for ringing in the ears. - Reports clogging of her ears since she was about 50yo - Has noted nasal congestion for 6months. Constant - Noted ringing in ear three weeks ago.  - Bilateral, however left worse than right  - Intermittent, but present more than not  - Worse in cars or when lying down  - No history of prior - Denies fevers, sinus pain, discharge - Some mild relief with Claritin. Has been using Flonase as well   - Smoking status discussed. Family history reviewed.  Review of Systems Per HPI    Objective:   Physical Exam  Constitutional: She appears well-developed and well-nourished. No distress.  HENT:  Head: Normocephalic and atraumatic.  Right Ear: External ear normal.  Left Ear: External ear normal.  Mouth/Throat: Oropharynx is clear and moist.  Tympanic membrane normal bilaterally, frontal sinuses symmetrical bilaterally  Eyes: Conjunctivae are normal. Pupils are equal, round, and reactive to light. Right eye exhibits no discharge. Left eye exhibits no discharge.  Cardiovascular: Normal rate and regular rhythm.  Exam reveals no gallop and no friction rub.   No murmur heard. Pulmonary/Chest: Effort normal. No respiratory distress. She has no wheezes. She has no rales.  Skin: No rash noted.  Psychiatric: She has a normal mood and affect. Her behavior is normal.      Assessment and Plan:     Nose congestion - Suspected to be contributing to otic symptoms - Course of Azithromycin given - Refer to ENT given protracted course - Follow up as needed

## 2015-06-23 ENCOUNTER — Telehealth: Payer: Self-pay | Admitting: Family Medicine

## 2015-06-23 NOTE — Telephone Encounter (Signed)
No records received.

## 2015-06-23 NOTE — Telephone Encounter (Signed)
Pt came into office wanting to know if her notes from [facility] have been received.  She wants to make sure the medications she was taking for anxiety are showing in her records so Dr Caroleen Hamman can know what she has taken and what worked.

## 2015-06-24 NOTE — Telephone Encounter (Signed)
Spoke to pt. She will go by the Dr.'s office today to see if she can get the records. Sunday Spillers, CMA

## 2015-06-30 NOTE — Telephone Encounter (Signed)
Pt went to [facility] to get her medical records. She was told they were sent here last Friday.  She has signed a release form here twice Please let pt know if the records have been received

## 2015-09-29 ENCOUNTER — Other Ambulatory Visit (HOSPITAL_COMMUNITY)
Admission: RE | Admit: 2015-09-29 | Discharge: 2015-09-29 | Disposition: A | Discharge status: Home / Self Care | Payer: BLUE CROSS/BLUE SHIELD | Source: Ambulatory Visit | Attending: Family Medicine | Admitting: Family Medicine

## 2015-09-29 ENCOUNTER — Other Ambulatory Visit: Payer: Self-pay | Admitting: Physician Assistant

## 2015-09-29 DIAGNOSIS — Z124 Encounter for screening for malignant neoplasm of cervix: Secondary | ICD-10-CM | POA: Insufficient documentation

## 2015-10-01 LAB — CYTOLOGY - PAP

## 2016-01-25 ENCOUNTER — Other Ambulatory Visit: Payer: Self-pay | Admitting: Physician Assistant

## 2016-01-25 DIAGNOSIS — Z1231 Encounter for screening mammogram for malignant neoplasm of breast: Secondary | ICD-10-CM

## 2016-02-14 ENCOUNTER — Ambulatory Visit
Admission: RE | Admit: 2016-02-14 | Discharge: 2016-02-14 | Disposition: A | Discharge status: Home / Self Care | Payer: BLUE CROSS/BLUE SHIELD | Source: Ambulatory Visit | Attending: Physician Assistant | Admitting: Physician Assistant

## 2016-02-14 DIAGNOSIS — Z1231 Encounter for screening mammogram for malignant neoplasm of breast: Secondary | ICD-10-CM

## 2016-02-15 ENCOUNTER — Other Ambulatory Visit: Payer: Self-pay | Admitting: Physician Assistant

## 2016-02-15 DIAGNOSIS — N63 Unspecified lump in unspecified breast: Secondary | ICD-10-CM

## 2016-02-25 ENCOUNTER — Ambulatory Visit
Admission: RE | Admit: 2016-02-25 | Discharge: 2016-02-25 | Disposition: A | Discharge status: Home / Self Care | Payer: BLUE CROSS/BLUE SHIELD | Source: Ambulatory Visit | Attending: Physician Assistant | Admitting: Physician Assistant

## 2016-02-25 ENCOUNTER — Other Ambulatory Visit: Payer: Self-pay | Admitting: Physician Assistant

## 2016-02-25 DIAGNOSIS — N63 Unspecified lump in unspecified breast: Secondary | ICD-10-CM

## 2016-05-26 ENCOUNTER — Inpatient Hospital Stay: Admission: RE | Admit: 2016-05-26 | Payer: BLUE CROSS/BLUE SHIELD | Source: Ambulatory Visit

## 2017-03-29 IMAGING — MG 2D DIGITAL DIAGNOSTIC BILATERAL MAMMOGRAM WITH CAD AND ADJUNCT T
8 of 16 series · 8 of 40 positions shown · non-contrast
Comparison: Previous exam(s).

CLINICAL DATA: 50-year-old female with palpable lump in the upper
inner right breast discovered on self-examination.

EXAM:
2D DIGITAL DIAGNOSTIC BILATERAL MAMMOGRAM WITH CAD AND ADJUNCT TOMO
ULTRASOUND RIGHT BREAST

[L MLO synth-2D]
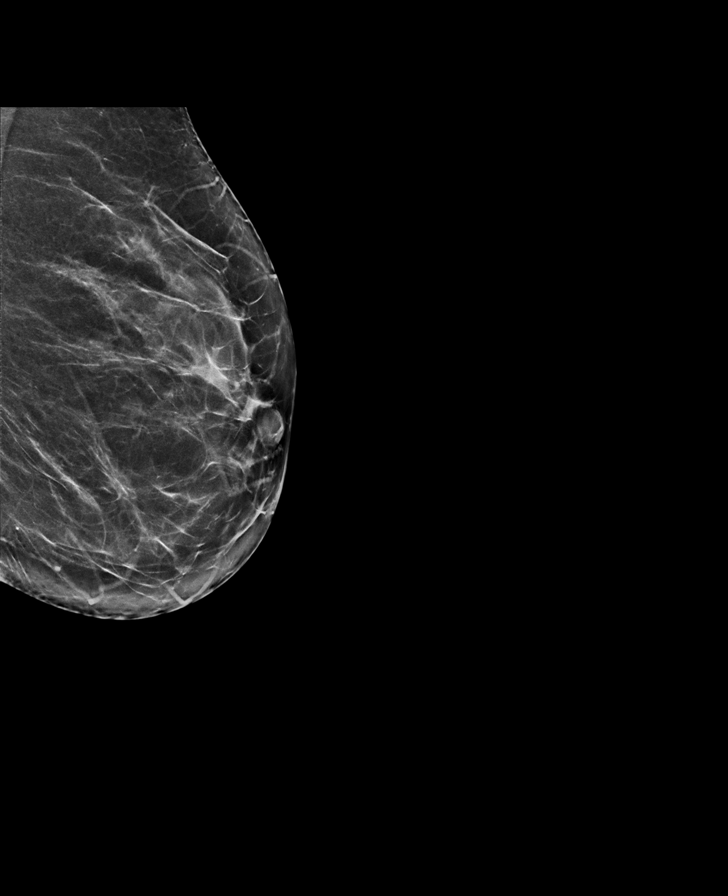

[R TAN]
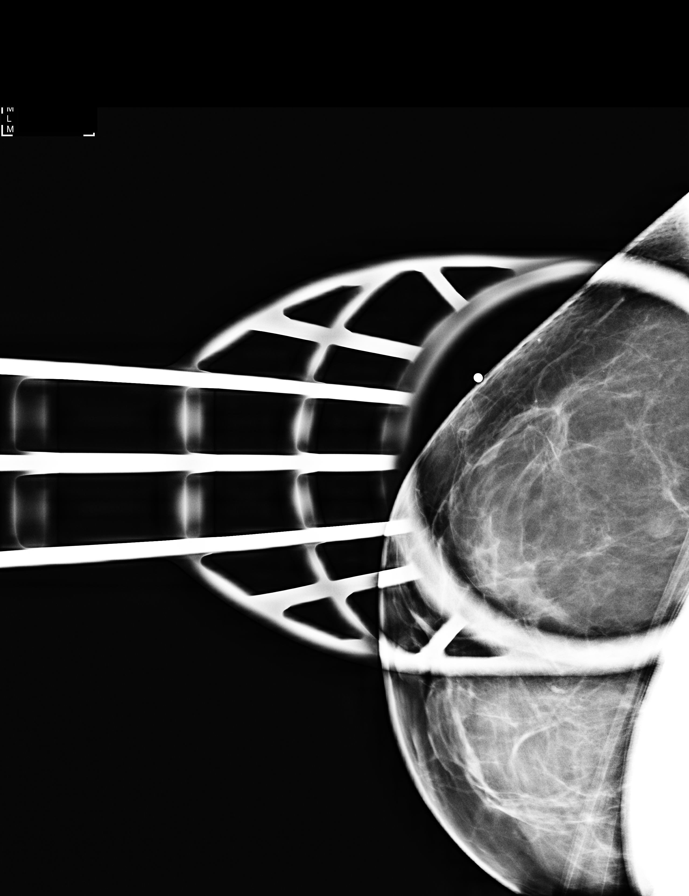

[R MLO]
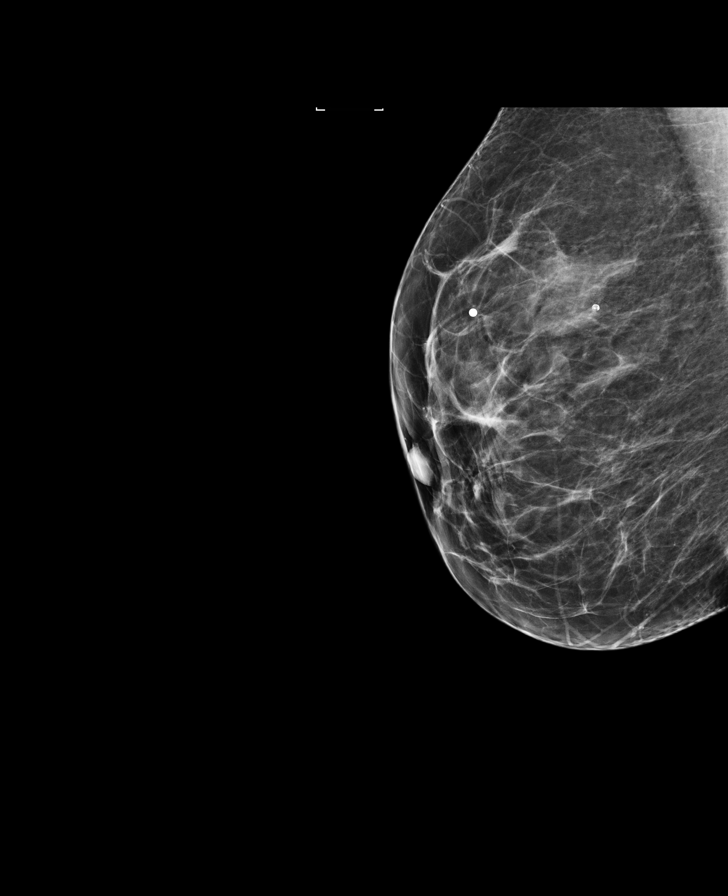

[R CC synth-2D]
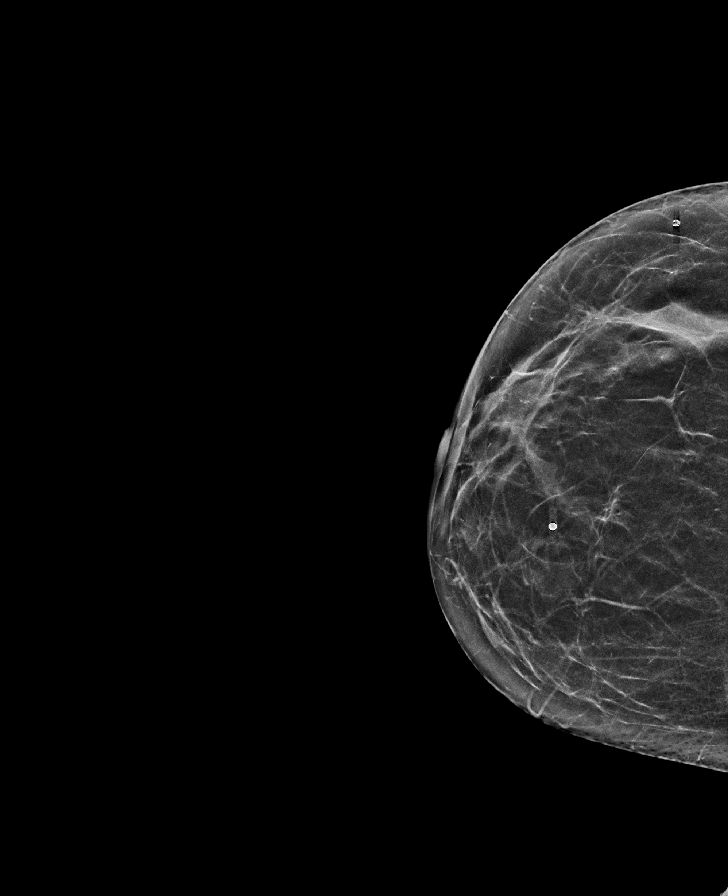

[L CC synth-2D]
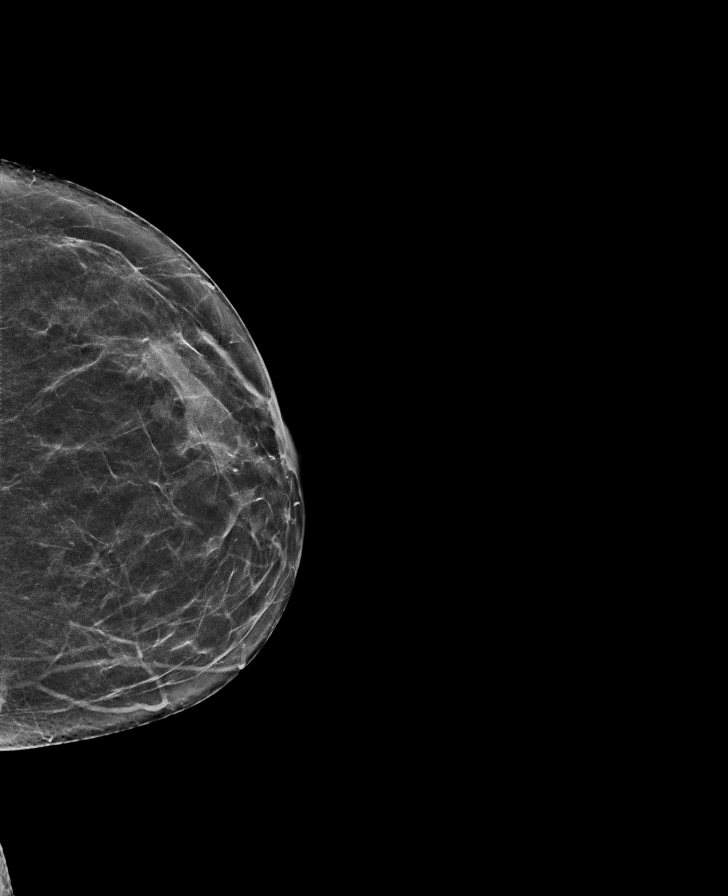

[L MLO]
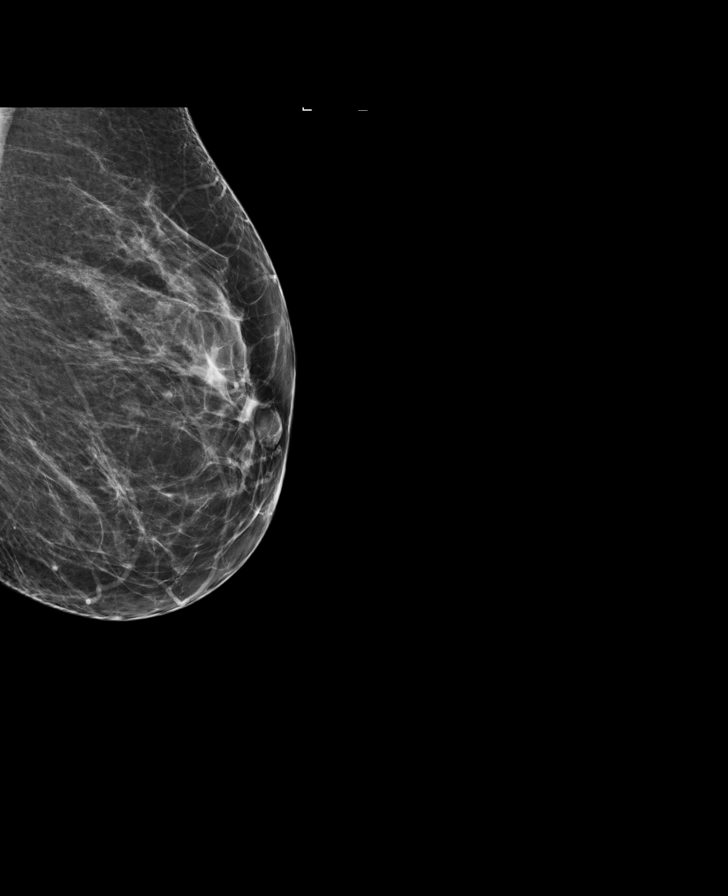

[R CC]
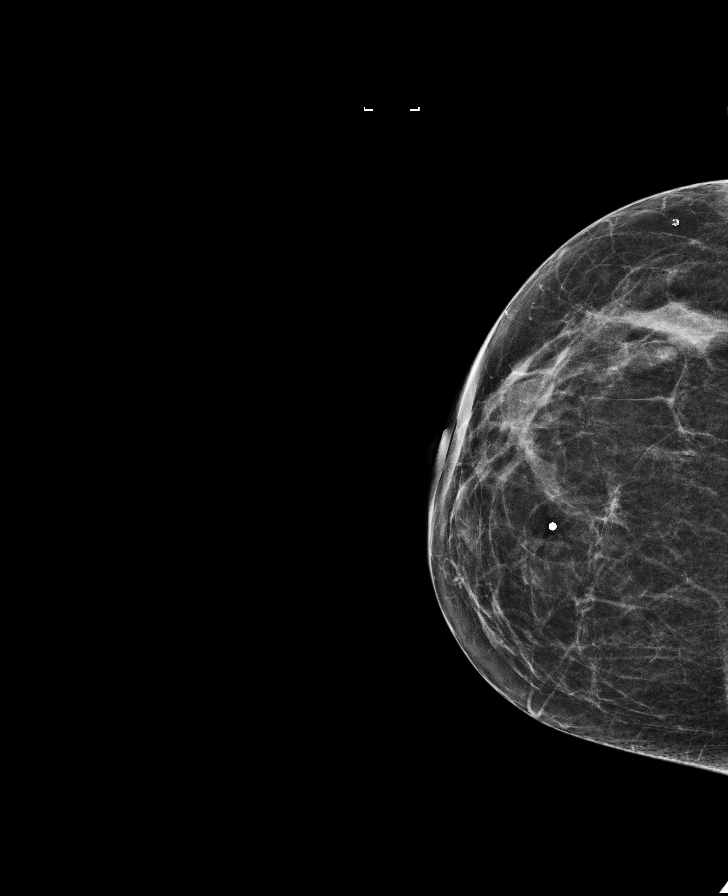

[R MLO synth-2D]
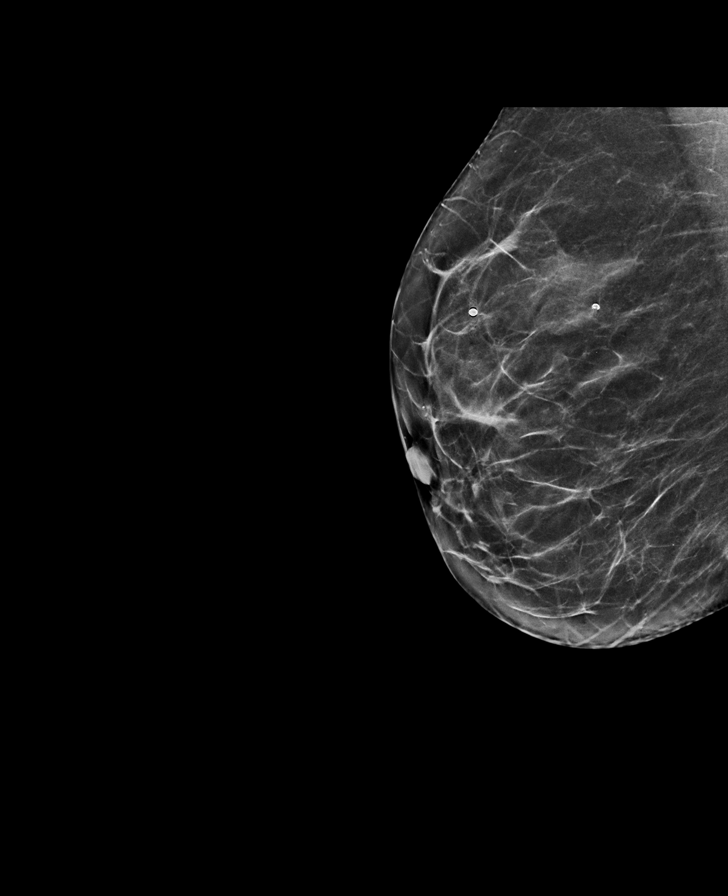

[8 of 40 positions shown; findings below may reference images not displayed]

ACR Breast Density Category b: There are scattered areas of
fibroglandular density.
FINDINGS: 2D and 3D full field and spot compression views of both breast
demonstrate no suspicious mass, distortion or worrisome
calcifications.

Mammographic images were processed with CAD.

On physical exam, a small palpable nodule at the 1 o'clock position
of the right breast 4 cm from the nipple is identified.

Targeted ultrasound is performed, showing a 1.6 x 0.4 x 1 cm mixed
hyperechoic and cystic structure at the 1 o'clock position of the
right breast 4 cm from the nipple likely representing fat necrosis
changes, and corresponding to the area of palpable concern.
IMPRESSION: Probable fat necrosis in the upper inner right breast corresponding
to the patient's palpable abnormality. 3 month followup is
recommended to ensure improvement/ resolution.

RECOMMENDATION:
Right breast ultrasound in 3 months.

I have discussed the findings and recommendations with the patient.
Results were also provided in writing at the conclusion of the
visit. If applicable, a reminder letter will be sent to the patient
regarding the next appointment.

BI-RADS CATEGORY  3: Probably benign.

## 2018-06-18 ENCOUNTER — Other Ambulatory Visit: Payer: Self-pay | Admitting: Family Medicine

## 2018-06-18 DIAGNOSIS — R413 Other amnesia: Secondary | ICD-10-CM

## 2018-12-25 DIAGNOSIS — F331 Major depressive disorder, recurrent, moderate: Secondary | ICD-10-CM | POA: Insufficient documentation

## 2019-01-13 DIAGNOSIS — F419 Anxiety disorder, unspecified: Secondary | ICD-10-CM | POA: Insufficient documentation

## 2019-07-03 DIAGNOSIS — J300 Vasomotor rhinitis: Secondary | ICD-10-CM | POA: Insufficient documentation

## 2019-07-03 DIAGNOSIS — F17210 Nicotine dependence, cigarettes, uncomplicated: Secondary | ICD-10-CM | POA: Insufficient documentation

## 2019-07-03 DIAGNOSIS — R0982 Postnasal drip: Secondary | ICD-10-CM | POA: Insufficient documentation

## 2020-04-14 DIAGNOSIS — F0280 Dementia in other diseases classified elsewhere without behavioral disturbance: Secondary | ICD-10-CM | POA: Insufficient documentation

## 2021-01-24 ENCOUNTER — Encounter (HOSPITAL_BASED_OUTPATIENT_CLINIC_OR_DEPARTMENT_OTHER): Payer: Self-pay | Admitting: Obstetrics and Gynecology

## 2021-01-24 ENCOUNTER — Emergency Department (HOSPITAL_BASED_OUTPATIENT_CLINIC_OR_DEPARTMENT_OTHER): Payer: BLUE CROSS/BLUE SHIELD | Admitting: Radiology

## 2021-01-24 ENCOUNTER — Other Ambulatory Visit: Payer: Self-pay

## 2021-01-24 ENCOUNTER — Emergency Department (HOSPITAL_BASED_OUTPATIENT_CLINIC_OR_DEPARTMENT_OTHER)
Admission: EM | Admit: 2021-01-24 | Discharge: 2021-01-24 | Disposition: A | Discharge status: Home / Self Care | Payer: BLUE CROSS/BLUE SHIELD | Attending: Emergency Medicine | Admitting: Emergency Medicine

## 2021-01-24 DIAGNOSIS — F17210 Nicotine dependence, cigarettes, uncomplicated: Secondary | ICD-10-CM | POA: Diagnosis not present

## 2021-01-24 DIAGNOSIS — W010XXA Fall on same level from slipping, tripping and stumbling without subsequent striking against object, initial encounter: Secondary | ICD-10-CM | POA: Insufficient documentation

## 2021-01-24 DIAGNOSIS — Y9389 Activity, other specified: Secondary | ICD-10-CM | POA: Insufficient documentation

## 2021-01-24 DIAGNOSIS — M25512 Pain in left shoulder: Secondary | ICD-10-CM | POA: Insufficient documentation

## 2021-01-24 NOTE — Discharge Instructions (Addendum)
Wear sling for comfort.  You should remove sling and perform gentle exercises, within the limits of pain and discomfort.  Treat pain with ibuprofen and Tylenol.  You should follow-up with a orthopedic doctor/sports medicine doctor.  There is a number below to call to schedule follow-up.

## 2021-01-24 NOTE — ED Triage Notes (Signed)
Patient reports she slipped and fell today and believes her shoulder is out of place

## 2021-01-24 NOTE — ED Provider Notes (Signed)
[facility] EMERGENCY DEPT Provider Note   CSN: [id number] Arrival date & time: 01/24/21  1357     History Chief Complaint  Patient presents with   Fall   Shoulder Pain    Ana Morgan is a 55 y.o. female.   Fall Pertinent negatives include no chest pain, no abdominal pain, no headaches and no shortness of breath.  Shoulder Pain Associated symptoms: no back pain, no fever and no neck pain   Patient presents following a fall today.  She works in English as a second language teacher and was cleaning a bathtub when she had a mechanical fall due to slipping on the bathtub mat.  When she fell, she had what she describes as a flexion of her left shoulder and had the sensation that her shoulder had been dislocated.  She states that she has had a history of multiple left shoulder dislocations.  She describes pain in the area of her left shoulder and decreased range of motion.  For this reason, she presents to the ED.  She states that she did not lose consciousness during the fall.  She did not strike any other areas of her body and does not have any other areas of discomfort.    Past Medical History:  Diagnosis Date   UTI (lower urinary tract infection)     Patient Active Problem List   Diagnosis Date Noted   Nose congestion 05/04/2015   Concern about skin cancer without diagnosis 04/13/2014   Epidermoid cyst 04/13/2014   Anxiety state 09/17/2007   PANIC DISORDER 09/17/2007   DEPRESSION 09/17/2007   DYSPEPSIA 09/17/2007    Past Surgical History:  Procedure Laterality Date   CESAREAN SECTION       OB History     Gravida      Para      Term      Preterm      AB      Living  3      SAB      IAB      Ectopic      Multiple      Live Births              No family history on file.  Social History   Tobacco Use   Smoking status: Every Day    Packs/day: 1.00    Years: 32.00    Pack years: 32.00    Types: Cigarettes   Smokeless tobacco: Never  Vaping Use    Vaping Use: Never used  Substance Use Topics   Alcohol use: Yes    Comment: Occasionally   Drug use: No    Home Medications Prior to Admission medications   Medication Sig Start Date End Date Taking? Authorizing Provider  azithromycin (ZITHROMAX) 250 MG tablet Please take two tablets (500mg ) today and then one daily to complete a five day course. 06/08/15   Rumley, [place] N, DO  fluconazole (DIFLUCAN) 150 MG tablet Take 1 tablet (150 mg total) by mouth once. 06/09/15   Rumley, [place] N, DO  ibuprofen (ADVIL,MOTRIN) 200 MG tablet Take 600-800 mg by mouth daily as needed (pain).    [provider]  ipratropium (ATROVENT) 0.03 % nasal spray Place 2 sprays into both nostrils every 12 (twelve) hours. 05/04/15   05/06/15, MD  loratadine (CLARITIN) 10 MG tablet Take 1 tablet (10 mg total) by mouth daily. 11/24/14   Street, 11/26/14, MD  nitrofurantoin, macrocrystal-monohydrate, (MACROBID) 100 MG capsule Take 1 capsule (100 mg total)  by mouth 2 (two) times daily. 12/24/14   Linna Hoff, MD  omeprazole (PRILOSEC) 20 MG capsule TAKE 1 CAPSULE (20 MG TOTAL) BY MOUTH DAILY. 05/10/15   Rumley, Lora Havens, DO  Phenazopyridine HCl (AZO TABS PO) Take by mouth. Has taken for 3 days, including today    [provider]  ranitidine (ZANTAC) 150 MG tablet TAKE 1 TABLET (150 MG TOTAL) BY MOUTH 2 (TWO) TIMES DAILY. 05/10/15   Rumley, [place] N, DO  sucralfate (CARAFATE) 1 G tablet TAKE 1 TABLET FOUR TIMES A DAY WITH MEALS AND AT BEDTIME 01/28/15   Rumley, [place] N, DO    Allergies    Amoxicillin, Penicillins, and Hydrocodone  Review of Systems   Review of Systems  Constitutional:  Negative for chills and fever.  HENT:  Negative for ear pain and sore throat.   Eyes:  Negative for pain and visual disturbance.  Respiratory:  Negative for cough and shortness of breath.   Cardiovascular:  Negative for chest pain and palpitations.  Gastrointestinal:  Negative for abdominal pain,  nausea and vomiting.  Genitourinary:  Negative for dysuria and hematuria.  Musculoskeletal:  Positive for arthralgias. Negative for back pain, gait problem, joint swelling, myalgias and neck pain.  Skin:  Negative for color change, rash and wound.  Neurological:  Negative for dizziness, seizures, syncope, weakness, light-headedness, numbness and headaches.  Psychiatric/Behavioral:  Negative for confusion and decreased concentration.   All other systems reviewed and are negative.  Physical Exam Updated Vital Signs BP (!) 133/91 (BP Location: Right Arm)   Pulse 68   Temp 98 F (36.7 C)   Resp 16   Ht 5' (1.524 m)   SpO2 100%   BMI 28.44 kg/m   Physical Exam Vitals and nursing note reviewed.  Constitutional:      General: She is not in acute distress.    Appearance: Normal appearance. She is well-developed. She is not ill-appearing, toxic-appearing or diaphoretic.  HENT:     Head: Normocephalic and atraumatic.     Right Ear: External ear normal.     Left Ear: External ear normal.     Nose: Nose normal.  Eyes:     Extraocular Movements: Extraocular movements intact.     Conjunctiva/sclera: Conjunctivae normal.  Cardiovascular:     Rate and Rhythm: Normal rate and regular rhythm.     Heart sounds: No murmur heard. Pulmonary:     Effort: Pulmonary effort is normal. No respiratory distress.     Breath sounds: Normal breath sounds.  Chest:     Chest wall: No tenderness.  Abdominal:     Palpations: Abdomen is soft.     Tenderness: There is no abdominal tenderness.  Musculoskeletal:        General: Tenderness present. No swelling or deformity.     Cervical back: Normal range of motion and neck supple. No rigidity or tenderness.  Skin:    General: Skin is warm and dry.  Neurological:     General: No focal deficit present.     Mental Status: She is alert and oriented to person, place, and time.     Cranial Nerves: No cranial nerve deficit.     Sensory: No sensory deficit.      Motor: No weakness.  Psychiatric:        Mood and Affect: Mood normal.        Behavior: Behavior normal.    ED Results / Procedures / Treatments   Labs (all labs ordered are  listed, but only abnormal results are displayed) Labs Reviewed - No data to display  EKG None  Radiology DG Shoulder Left  Result Date: 01/24/2021 CLINICAL DATA:  Pain post fall EXAM: LEFT SHOULDER - 2+ VIEW COMPARISON:  01/03/2016 FINDINGS: There is no evidence of fracture or dislocation. There is no evidence of arthropathy or other focal bone abnormality. Soft tissues are unremarkable. IMPRESSION: Negative. Electronically Signed   By: Corlis Leak M.D.   On: 01/24/2021 15:09    Procedures Procedures   Medications Ordered in ED Medications - No data to display  ED Course  I have reviewed the triage vital signs and the nursing notes.  Pertinent labs & imaging results that were available during my care of the patient were reviewed by me and considered in my medical decision making (see chart for details).  Clinical Course as of 01/25/21 1215  Mon Jan 24, 2021  1640 DG Shoulder Left [RD]    Clinical Course User Index [RD] Gloris Manchester, MD   MDM Rules/Calculators/A&P                           Patient is a 55 year old female with history of multiple dislocations of her left shoulder, who presents today following a fall during which she believes she dislocated her left shoulder again.  Fall occurred several hours prior to arrival.  Following the fall, she noted pain in her left shoulder and decreased range of motion.  She arrived in the ED and, while in the waiting room, was able to undergo left shoulder x-ray.  X-ray showed no evidence of dislocation.  On exam, patient has no deformity to left shoulder.  She does have some tenderness on the posterior aspect.  Range of motion of left shoulder is limited by pain.  Given her history, patient may have had a dislocation followed by spontaneous reduction.  Sling was  provided for comfort.  Patient was advised to perform gentle rotator cuff exercises, within tolerance of her pain and to follow-up with an orthopedic doctor to ensure good healing.  Patient was discharged in good condition.  Final Clinical Impression(s) / ED Diagnoses Final diagnoses:  Acute pain of left shoulder    Rx / DC Orders ED Discharge Orders     None        Gloris Manchester, MD 01/25/21 1216

## 2021-01-26 ENCOUNTER — Encounter: Payer: Self-pay | Admitting: Family Medicine

## 2021-01-26 ENCOUNTER — Ambulatory Visit: Payer: BLUE CROSS/BLUE SHIELD | Admitting: Family Medicine

## 2021-01-26 ENCOUNTER — Ambulatory Visit: Payer: Self-pay

## 2021-01-26 ENCOUNTER — Other Ambulatory Visit: Payer: Self-pay

## 2021-01-26 VITALS — BP 118/76 | Ht 59.5 in | Wt 145.0 lb

## 2021-01-26 DIAGNOSIS — S43002A Unspecified subluxation of left shoulder joint, initial encounter: Secondary | ICD-10-CM | POA: Diagnosis not present

## 2021-01-26 DIAGNOSIS — M25512 Pain in left shoulder: Secondary | ICD-10-CM

## 2021-01-26 MED ORDER — KETOROLAC TROMETHAMINE 30 MG/ML IJ SOLN
30.0000 mg | Freq: Once | INTRAMUSCULAR | Status: AC
  Administered 2021-01-26: 30 mg via INTRAMUSCULAR

## 2021-01-26 MED ORDER — METHYLPREDNISOLONE ACETATE 40 MG/ML IJ SUSP
40.0000 mg | Freq: Once | INTRAMUSCULAR | Status: AC
  Administered 2021-01-26: 40 mg via INTRAMUSCULAR

## 2021-01-26 NOTE — Patient Instructions (Signed)
Nice to meet you Please use ice  Please try the range of motion movements  Please use the sling as needed for comfort   Please send me a message in MyChart with any questions or updates.  Please see me back in 3 weeks.   --Dr. Jordan Likes

## 2021-01-26 NOTE — Assessment & Plan Note (Signed)
Initial injury on 8/22.  Has had history of repeated dislocations.  Effusion noted on ultrasound today. -Counseled on home exercise therapy and supportive care. -IM Toradol and Depo-Medrol. -Sling and counseled on its use. -Could consider physical therapy or injection.

## 2021-01-26 NOTE — Progress Notes (Signed)
  Ana Morgan - 55 y.o. female MRN [id number]  Date of birth: [birth year]  SUBJECTIVE:  Including CC & ROS.  No chief complaint on file.   Ana Morgan is a 55 y.o. female that is presenting with acute on chronic left shoulder pain.  She fell in the bathroom and felt like her shoulder came out of socket..  She has a history of previous dislocation from several years ago.  She has pain and limitations in her range of motion..  Independent review of the left shoulder x-ray from 8/22 shows no acute changes.   Review of Systems See HPI   HISTORY: Past Medical, Surgical, Social, and Family History Reviewed & Updated per EMR.   Pertinent Historical Findings include:  Past Medical History:  Diagnosis Date   UTI (lower urinary tract infection)     Past Surgical History:  Procedure Laterality Date   CESAREAN SECTION      History reviewed. No pertinent family history.  Social History   Socioeconomic History   Marital status: Divorced    Spouse name: Not on file   Number of children: Not on file   Years of education: Not on file   Highest education level: Not on file  Occupational History   Not on file  Tobacco Use   Smoking status: Every Day    Packs/day: 1.00    Years: 32.00    Pack years: 32.00    Types: Cigarettes   Smokeless tobacco: Never  Vaping Use   Vaping Use: Never used  Substance and Sexual Activity   Alcohol use: Yes    Comment: Occasionally   Drug use: No   Sexual activity: Yes  Other Topics Concern   Not on file  Social History Narrative   Not on file   Social Determinants of Health   Financial Resource Strain: Not on file  Food Insecurity: Not on file  Transportation Needs: Not on file  Physical Activity: Not on file  Stress: Not on file  Social Connections: Not on file  Intimate Partner Violence: Not on file     PHYSICAL EXAM:  VS: BP 118/76 (BP Location: Left Arm, Patient Position: Sitting, Cuff Size: Normal)   Ht 4' 11.5" (1.511 m)    Wt 145 lb (65.8 kg)   BMI 28.80 kg/m  Physical Exam Gen: NAD, alert, cooperative with exam, well-appearing MSK:  Left shoulder: Limited external rotation actively. Has more range of motion passively. Limited abduction and flexion. No swelling or ecchymosis. Neurovascular intact  Limited ultrasound: Left shoulder:  No changes of the biceps tendon. Normal-appearing subscapularis.  There does appear to be an overlying effusion emanating from the anterior but humeral joint. Normal-appearing subscapularis and AC joint. Effusion noted in the posterior glenohumeral joint  Summary: Effusion of the glenohumeral joint.  Ultrasound and interpretation by Clare Gandy, MD    ASSESSMENT & PLAN:   Shoulder subluxation, left, initial encounter Initial injury on 8/22.  Has had history of repeated dislocations.  Effusion noted on ultrasound today. -Counseled on home exercise therapy and supportive care. -IM Toradol and Depo-Medrol. -Sling and counseled on its use. -Could consider physical therapy or injection.

## 2021-02-16 ENCOUNTER — Ambulatory Visit: Payer: BLUE CROSS/BLUE SHIELD | Admitting: Family Medicine

## 2022-02-26 IMAGING — DX DG SHOULDER 2+V*L*
3 series · 3 of 3 positions shown · non-contrast
Comparison: 01/03/2016

CLINICAL DATA: Pain post fall

EXAM:
LEFT SHOULDER - 2+ VIEW

[shoulder grashey]
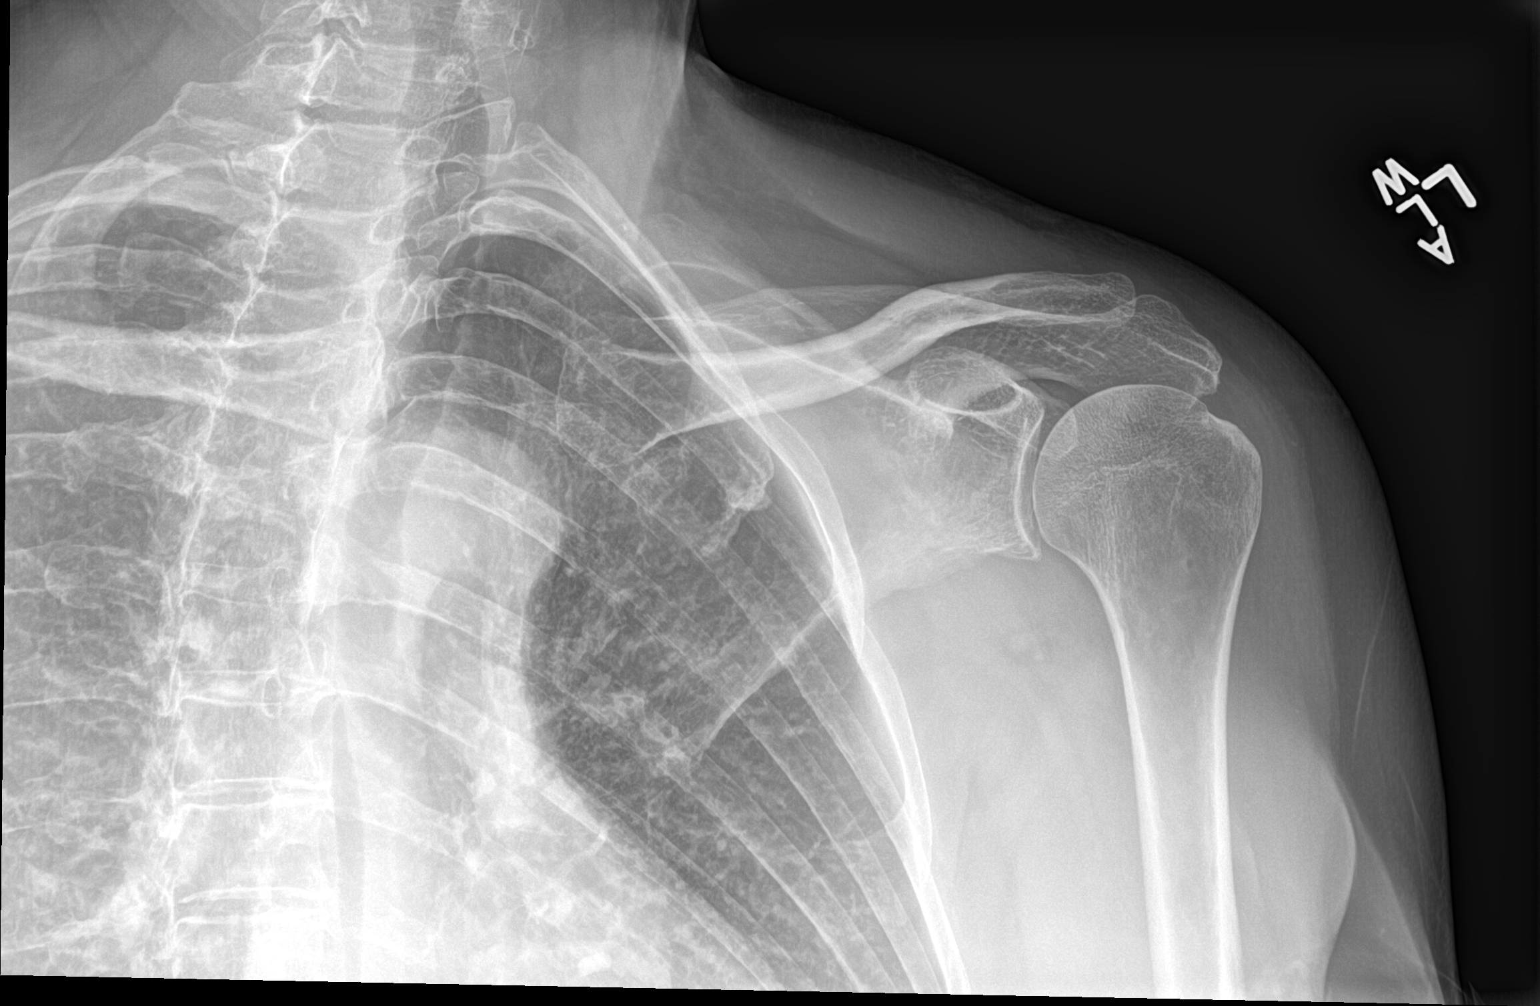

[shoulder y view]
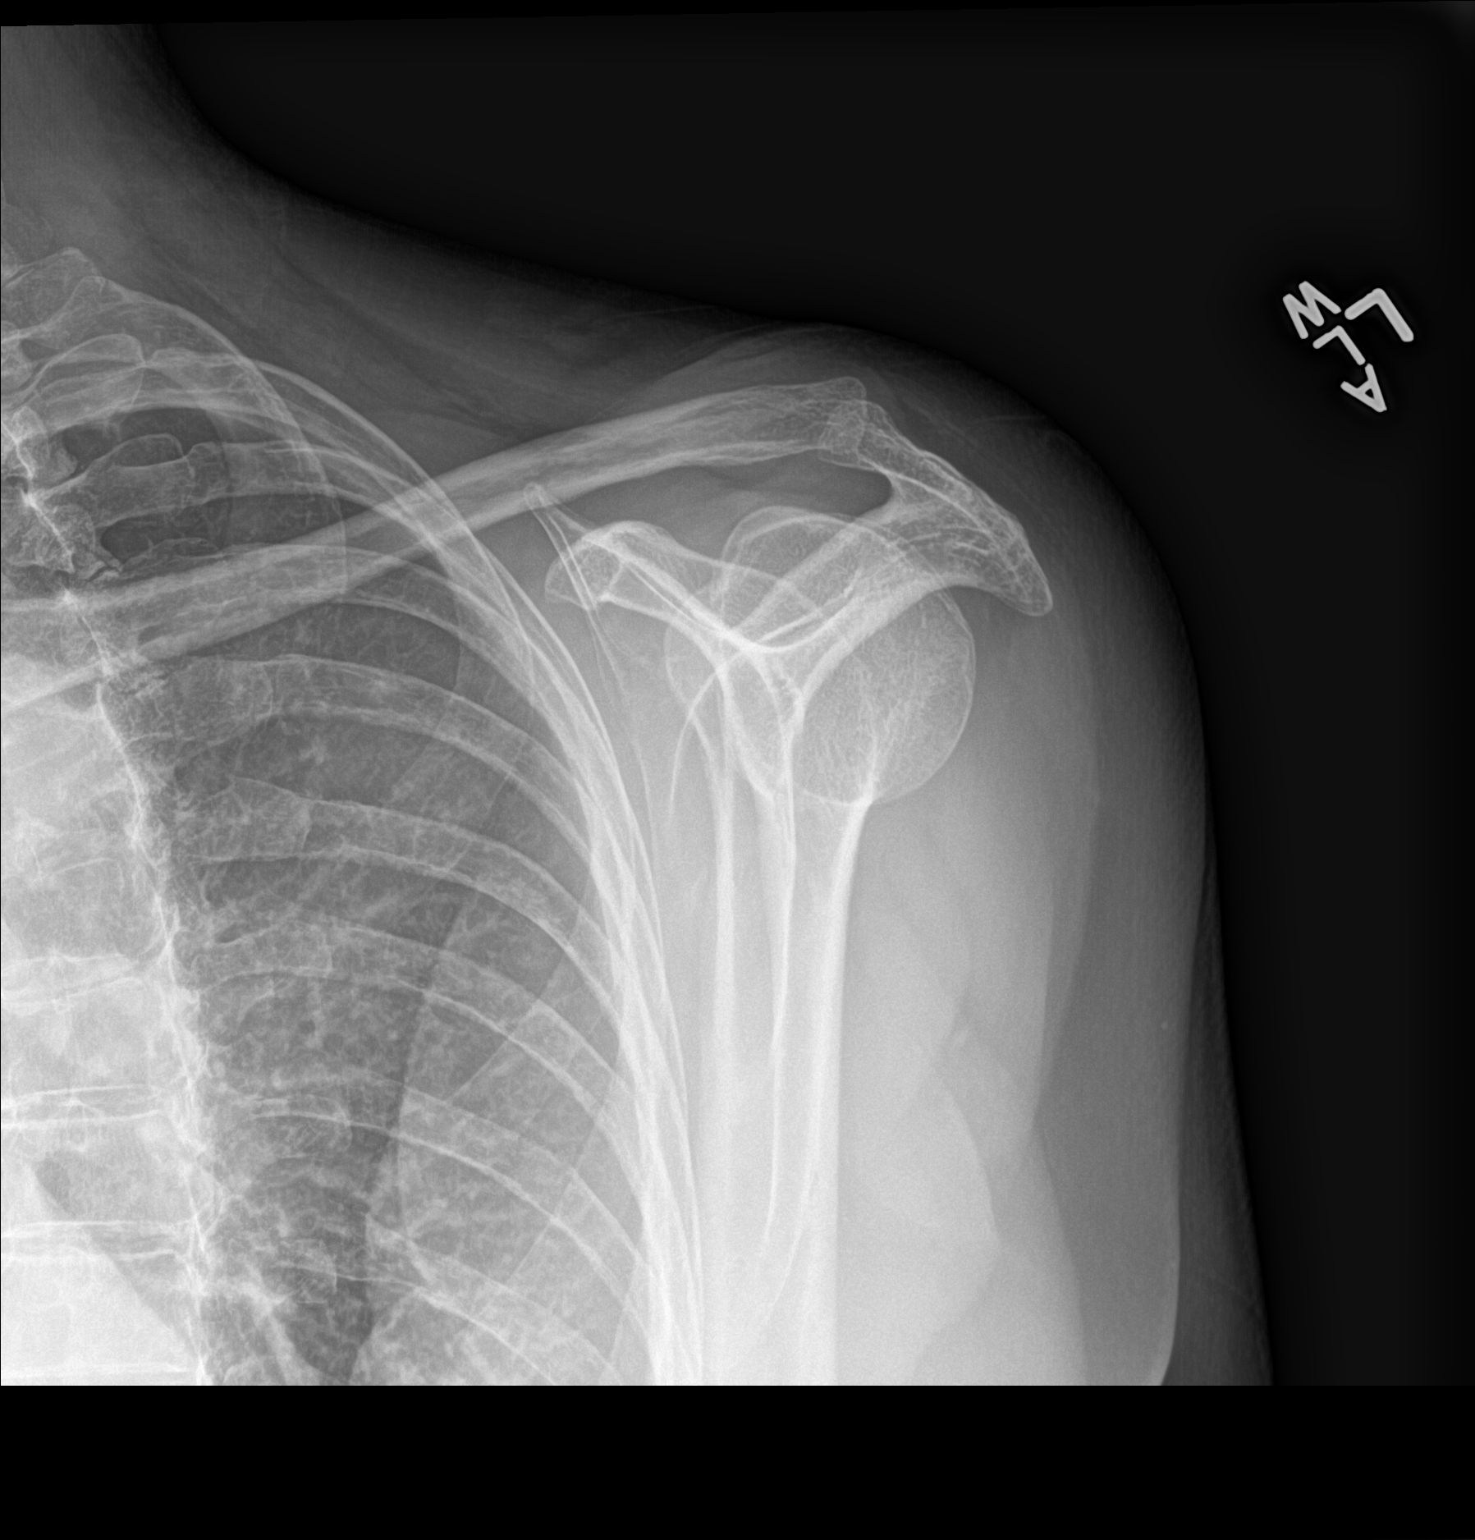

[shoulder axillary]
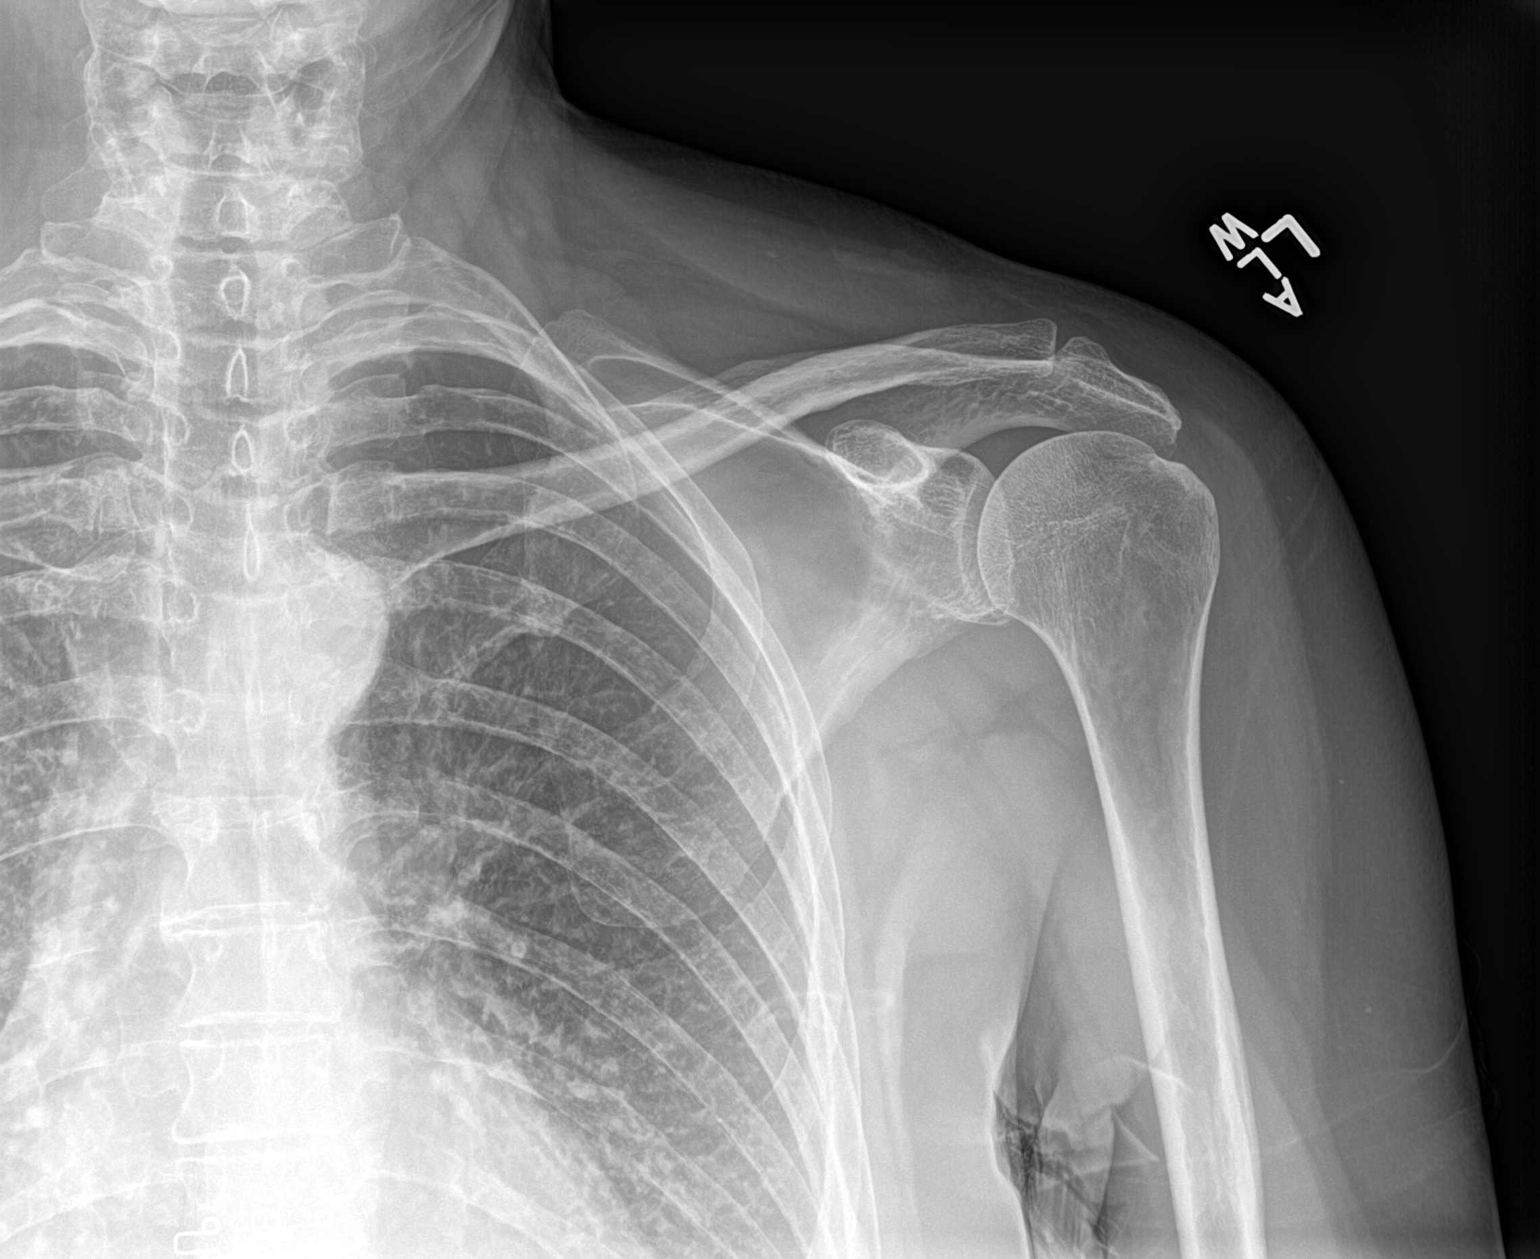

[3 of 3 positions shown; findings below may reference images not displayed]

FINDINGS: There is no evidence of fracture or dislocation. There is no
evidence of arthropathy or other focal bone abnormality. Soft
tissues are unremarkable.
IMPRESSION: Negative.

## 2022-09-19 ENCOUNTER — Encounter: Payer: Self-pay | Admitting: *Deleted

## 2023-02-09 DIAGNOSIS — L659 Nonscarring hair loss, unspecified: Secondary | ICD-10-CM | POA: Diagnosis not present

## 2023-02-09 DIAGNOSIS — M79671 Pain in right foot: Secondary | ICD-10-CM | POA: Diagnosis not present

## 2023-02-09 DIAGNOSIS — R7989 Other specified abnormal findings of blood chemistry: Secondary | ICD-10-CM | POA: Diagnosis not present

## 2023-03-28 DIAGNOSIS — G300 Alzheimer's disease with early onset: Secondary | ICD-10-CM | POA: Diagnosis not present

## 2023-03-28 DIAGNOSIS — R5383 Other fatigue: Secondary | ICD-10-CM | POA: Diagnosis not present

## 2023-03-28 DIAGNOSIS — Z79899 Other long term (current) drug therapy: Secondary | ICD-10-CM | POA: Diagnosis not present

## 2023-03-28 DIAGNOSIS — F0280 Dementia in other diseases classified elsewhere without behavioral disturbance: Secondary | ICD-10-CM | POA: Diagnosis not present

## 2023-07-30 DIAGNOSIS — F0280 Dementia in other diseases classified elsewhere without behavioral disturbance: Secondary | ICD-10-CM | POA: Diagnosis not present

## 2023-07-30 DIAGNOSIS — Z1211 Encounter for screening for malignant neoplasm of colon: Secondary | ICD-10-CM | POA: Diagnosis not present

## 2023-07-30 DIAGNOSIS — F419 Anxiety disorder, unspecified: Secondary | ICD-10-CM | POA: Diagnosis not present

## 2023-07-30 DIAGNOSIS — Z0000 Encounter for general adult medical examination without abnormal findings: Secondary | ICD-10-CM | POA: Diagnosis not present

## 2023-07-30 DIAGNOSIS — F32A Depression, unspecified: Secondary | ICD-10-CM | POA: Diagnosis not present

## 2023-07-30 DIAGNOSIS — G300 Alzheimer's disease with early onset: Secondary | ICD-10-CM | POA: Diagnosis not present

## 2023-07-30 DIAGNOSIS — Z1231 Encounter for screening mammogram for malignant neoplasm of breast: Secondary | ICD-10-CM | POA: Diagnosis not present

## 2023-10-01 DIAGNOSIS — F0280 Dementia in other diseases classified elsewhere without behavioral disturbance: Secondary | ICD-10-CM | POA: Diagnosis not present

## 2023-10-01 DIAGNOSIS — F32A Depression, unspecified: Secondary | ICD-10-CM | POA: Diagnosis not present

## 2023-10-01 DIAGNOSIS — F22 Delusional disorders: Secondary | ICD-10-CM | POA: Diagnosis not present

## 2023-10-01 DIAGNOSIS — G300 Alzheimer's disease with early onset: Secondary | ICD-10-CM | POA: Diagnosis not present

## 2023-10-08 DIAGNOSIS — F22 Delusional disorders: Secondary | ICD-10-CM | POA: Insufficient documentation

## 2023-12-05 DIAGNOSIS — F0280 Dementia in other diseases classified elsewhere without behavioral disturbance: Secondary | ICD-10-CM | POA: Diagnosis not present

## 2023-12-05 DIAGNOSIS — G300 Alzheimer's disease with early onset: Secondary | ICD-10-CM | POA: Diagnosis not present

## 2023-12-05 DIAGNOSIS — G4730 Sleep apnea, unspecified: Secondary | ICD-10-CM | POA: Diagnosis not present

## 2023-12-12 DIAGNOSIS — G4730 Sleep apnea, unspecified: Secondary | ICD-10-CM | POA: Insufficient documentation

## 2023-12-23 DIAGNOSIS — R0683 Snoring: Secondary | ICD-10-CM | POA: Diagnosis not present

## 2023-12-24 DIAGNOSIS — R0683 Snoring: Secondary | ICD-10-CM | POA: Diagnosis not present

## 2024-01-16 DIAGNOSIS — G300 Alzheimer's disease with early onset: Secondary | ICD-10-CM | POA: Diagnosis not present

## 2024-01-16 DIAGNOSIS — F411 Generalized anxiety disorder: Secondary | ICD-10-CM | POA: Diagnosis not present

## 2024-01-25 ENCOUNTER — Encounter: Payer: Self-pay | Admitting: Emergency Medicine

## 2024-01-25 ENCOUNTER — Ambulatory Visit: Admission: EM | Admit: 2024-01-25 | Discharge: 2024-01-25 | Disposition: A | Discharge status: Home / Self Care

## 2024-01-25 DIAGNOSIS — S0502XA Injury of conjunctiva and corneal abrasion without foreign body, left eye, initial encounter: Secondary | ICD-10-CM | POA: Diagnosis not present

## 2024-01-25 MED ORDER — POLYMYXIN B-TRIMETHOPRIM 10000-0.1 UNIT/ML-% OP SOLN: 1.0000 [drp] | OPHTHALMIC | 0 refills | Status: AC

## 2024-01-25 NOTE — ED Provider Notes (Signed)
 [facility]    CSN: [id number] Arrival date & time: 01/25/24  1653      History   Chief Complaint Chief Complaint  Patient presents with   Eye Problem   Eye Pain    HPI Ana Morgan is a 58 y.o. female.   Discussed the use of AI scribe software for clinical note transcription with the patient, who gave verbal consent to proceed.   The patient presents with left eye pain that started suddenly today around 2 PM. The eye pain is described as a pressure-like sensation, localized above the left eyelid. The patient initially felt like something might have been in the eye and attempted to alleviate the discomfort with over-the-counter eye drops as well as flushing the eye out which provided no relief. The pain is exacerbated by eye movement. The patient reports associated blurred vision and light sensitivity.  Additionally, the patient experiences pressure-like pain on the left side of the head, which she distinguishes from a typical headache. She denies any drainage from the eye, nausea, vomiting, dizziness, numbness, or tingling in the face. The patient wears regular glasses for distance vision but does not use contact lenses. She reports no history of glaucoma or other eye issues.  The following portions of the patient's history were reviewed and updated as appropriate: allergies, current medications, past family history, past medical history, past social history, past surgical history, and problem list.    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   Anxiety and depression  01/13/2019   MDD (major depressive disorder), recurrent episode, moderate (HCC) 12/25/2018   Nose congestion 05/04/2015   Concern about skin cancer without diagnosis 04/13/2014   Epidermoid cyst 04/13/2014   Anxiety state 09/17/2007   Panic disorder 09/17/2007   Depression 09/17/2007   DYSPEPSIA 09/17/2007   Dyspepsia 09/17/2007   Gastrointestinal irritation 09/17/2007    Past Surgical History:  Procedure Laterality Date   CESAREAN SECTION      OB History     Gravida      Para      Term      Preterm      AB      Living  3      SAB      IAB      Ectopic      Multiple      Live Births               Home Medications    Prior to Admission medications   Medication Sig Start Date End Date Taking? Authorizing Provider  busPIRone (BUSPAR) 5 MG tablet Take 5 mg by mouth. 11/24/21  Yes [provider]  citalopram (CELEXA) 40 MG tablet Take 40 mg by mouth daily. 12/26/21  Yes [provider]  hydrOXYzine (ATARAX) 10 MG tablet Take 10 mg by mouth. 07/25/21  Yes [provider]  ibuprofen (ADVIL) 600 MG tablet Take one every 6-8 hours x 5 days, then prn 02/07/22  Yes [provider]  ibuprofen (ADVIL,MOTRIN) 200 MG tablet Take 600-800 mg by mouth daily as needed (pain).   Yes [provider]  memantine (NAMENDA) 10 MG tablet Take 10 mg by mouth. 05/24/22  Yes [provider]  trimethoprim -polymyxin b  (POLYTRIM ) ophthalmic solution Place 1 drop into the left eye every 3 (three) hours while awake for 7 days. 01/25/24 02/01/24 Yes Merlen Gurry, FNP  ipratropium (ATROVENT ) 0.03 % nasal spray Place 2 sprays into both nostrils every 12 (twelve) hours. 05/04/15   Chick Venetia BRAVO, MD  loratadine  (CLARITIN ) 10 MG tablet Take 1 tablet (10 mg total) by mouth daily. 11/24/14   Street, Lonni HERO, MD  omeprazole  (PRILOSEC) 20 MG capsule TAKE 1 CAPSULE (20 MG TOTAL) BY MOUTH DAILY. 05/10/15   Rumley, [place]asqu[place] N, DO   pantoprazole (PROTONIX) 40 MG tablet Take 40 mg by mouth daily.    [provider]    Family History History reviewed. No pertinent family history.  Social History Social History   Tobacco Use   Smoking status: Every Day    Current packs/day: 1.00    Average packs/day: 1 pack/day for 32.0 years (32.0 ttl pk-yrs)    Types: Cigarettes   Smokeless tobacco: Never  Vaping Use   Vaping status: Never Used  Substance Use Topics   Alcohol use: Yes    Comment: Occasionally   Drug use: No     Allergies   Amoxicillin, Donepezil, Penicillins, and Hydrocodone   Review of Systems Review of Systems  Constitutional:  Negative for fatigue and fever.  Eyes:  Positive for photophobia, pain (left) and visual disturbance (blurry of left eye). Negative for discharge and itching.  Gastrointestinal:  Negative for nausea and vomiting.  Musculoskeletal:  Negative for neck pain and neck stiffness.  Neurological:  Positive for headaches (pressure to the left side of head). Negative for dizziness and numbness.  All other systems reviewed and are negative.    Physical Exam Triage Vital Signs ED Triage Vitals  Encounter Vitals Group     BP 01/25/24 1743 105/70     Girls Systolic BP Percentile --      Girls Diastolic BP Percentile --      Boys Systolic BP Percentile --      Boys Diastolic BP Percentile --      Pulse Rate 01/25/24 1743 61     Resp 01/25/24 1743 18     Temp 01/25/24 1743 97.9 F (36.6 C)     Temp Source 01/25/24 1743 Oral     SpO2 01/25/24 1743 96 %     Weight --      Height --      Head Circumference --      Peak Flow --      Pain Score 01/25/24 1749 7     Pain Loc --      Pain Education --      Exclude from Growth Chart --    No data found.  Updated Vital Signs BP 105/70 (BP Location: Left Arm)   Pulse 61   Temp 97.9 F (36.6 C) (Oral)   Resp 18   SpO2 96%   Visual Acuity Right Eye Distance: 20/20 (uncorrected) Left Eye Distance: 20/20  (uncorrected) Bilateral Distance: 20/15 (uncorrected)  Right Eye Near:   Left Eye Near:    Bilateral Near:     Physical Exam Vitals reviewed.  Constitutional:      General: She is awake. She is not in acute distress.    Appearance: Normal appearance. She is well-developed. She is not ill-appearing, toxic-appearing or diaphoretic.  HENT:     Head: Normocephalic.  Right Ear: Hearing normal.     Left Ear: Hearing normal.     Nose: Nose normal.     Mouth/Throat:     Mouth: Mucous membranes are moist.  Eyes:     General: Lids are normal. Lids are everted, no foreign bodies appreciated. Vision grossly intact. No visual field deficit.       Left eye: No foreign body, discharge or hordeolum.     Extraocular Movements: Extraocular movements intact.     Left eye: Normal extraocular motion and no nystagmus.     Conjunctiva/sclera: Conjunctivae normal.     Left eye: Left conjunctiva is not injected. No chemosis, exudate or hemorrhage.    Pupils: Pupils are equal, round, and reactive to light.     Left eye: Corneal abrasion and fluorescein uptake present.  Cardiovascular:     Rate and Rhythm: Normal rate and regular rhythm.     Heart sounds: Normal heart sounds.  Pulmonary:     Effort: Pulmonary effort is normal.     Breath sounds: Normal breath sounds and air entry.  Musculoskeletal:        General: Normal range of motion.     Cervical back: Full passive range of motion without pain, normal range of motion and neck supple.  Skin:    General: Skin is warm and dry.  Neurological:     General: No focal deficit present.     Mental Status: She is alert and oriented to person, place, and time.  Psychiatric:        Speech: Speech normal.        Behavior: Behavior is cooperative.      UC Treatments / Results  Labs (all labs ordered are listed, but only abnormal results are displayed) Labs Reviewed - No data to display  EKG   Radiology No results  found.  Procedures Procedures (including critical care time)  Medications Ordered in UC Medications - No data to display  Initial Impression / Assessment and Plan / UC Course  I have reviewed the triage vital signs and the nursing notes.  Pertinent labs & imaging results that were available during my care of the patient were reviewed by me and considered in my medical decision making (see chart for details).     The patient presents with sudden onset of left eye pain beginning earlier today, described as a foreign body sensation with associated pressure above the eyelid. Pain is worsened by eye movement and is accompanied by blurred vision and light sensitivity. There is no drainage, redness, or bloodshot appearance, and the patient denies any history of glaucoma or prior eye disease. Fluorescein staining confirmed a corneal abrasion. There are no associated neurological symptoms such as dizziness, numbness, or facial tingling. The patient was prescribed Polytrim  antibiotic eye drops to use every three hours while awake and advised on supportive measures including wearing eye protection, resting the eyes, avoiding bright light, and using a cold compress without rubbing. Instructions were given to avoid rubbing or washing the eye and not to wear an eye patch. The patient was advised to follow up with an eye doctor if symptoms persist through the weekend and to seek urgent or emergency evaluation if new symptoms arise, including redness, watery eye, colored discharge, eyelid swelling, worsening vision changes, or loss of eyesight.  Today's evaluation has revealed no signs of a dangerous process. Discussed diagnosis with patient and/or guardian. Patient and/or guardian aware of their diagnosis, possible red flag symptoms to watch out for  and need for close follow up. Patient and/or guardian understands verbal and written discharge instructions. Patient and/or guardian comfortable with plan and  disposition.  Patient and/or guardian has a clear mental status at this time, good insight into illness (after discussion and teaching) and has clear judgment to make decisions regarding their care  Documentation was completed with the aid of voice recognition software. Transcription may contain typographical errors. Final Clinical Impressions(s) / UC Diagnoses   Final diagnoses:  Abrasion of left cornea, initial encounter     Discharge Instructions      You were seen today for pain to the left eye, most likely a corneal abrasion, which is a scratch to the clear surface of the eye. This type of injury can be painful but usually heals well with proper care. You were prescribed antibiotic eye drops to prevent infection. Use them exactly as directed and avoid letting the tip of the bottle touch your eye. You may also use over-the-counter artificial tears such as Refresh drops as needed to relieve discomfort.  To help manage symptoms, apply a cold compress over the eye for short periods to reduce pain and swelling. You may take Tylenol  or ibuprofen for additional pain relief. Rest your eyes by limiting screen time and reading, and avoid exposure to bright light. Wearing sunglasses may help reduce sensitivity.  Follow up with an eye specialist or go to the emergency department if symptoms persist beyond three days, if your vision worsens, if you notice increasing redness, swelling, or discharge from the eye, if your eyelids become stuck together, or if symptoms return after healing. Seek immediate care if you develop severe headache, nausea, or vomiting.     ED Prescriptions     Medication Sig Dispense Auth. Provider   trimethoprim -polymyxin b  (POLYTRIM ) ophthalmic solution Place 1 drop into the left eye every 3 (three) hours while awake for 7 days. 10 mL Iola Lukes, FNP      PDMP not reviewed this encounter.   Iola Lukes, [place] 01/25/24 1904

## 2024-01-25 NOTE — Discharge Instructions (Addendum)
 You were seen today for pain to the left eye, most likely a corneal abrasion, which is a scratch to the clear surface of the eye. This type of injury can be painful but usually heals well with proper care. You were prescribed antibiotic eye drops to prevent infection. Use them exactly as directed and avoid letting the tip of the bottle touch your eye. You may also use over-the-counter artificial tears such as Refresh drops as needed to relieve discomfort.  To help manage symptoms, apply a cold compress over the eye for short periods to reduce pain and swelling. You may take Tylenol  or ibuprofen for additional pain relief. Rest your eyes by limiting screen time and reading, and avoid exposure to bright light. Wearing sunglasses may help reduce sensitivity.  Follow up with an eye specialist or go to the emergency department if symptoms persist beyond three days, if your vision worsens, if you notice increasing redness, swelling, or discharge from the eye, if your eyelids become stuck together, or if symptoms return after healing. Seek immediate care if you develop severe headache, nausea, or vomiting.

## 2024-01-25 NOTE — ED Triage Notes (Signed)
 Pt reports L eye pain that started 4hrs ago. No specific injury and unsure of what caused current problem. Notes sharp pain that hurts most behind upper lid. Eye feels heavy like she can't hold it open. Completed eye wash and eye drops (unknown name) at home with no relief. Notes photosensitivity.

## 2024-02-20 DIAGNOSIS — G300 Alzheimer's disease with early onset: Secondary | ICD-10-CM | POA: Diagnosis not present

## 2024-02-20 DIAGNOSIS — F411 Generalized anxiety disorder: Secondary | ICD-10-CM | POA: Diagnosis not present

## 2024-04-02 DIAGNOSIS — F411 Generalized anxiety disorder: Secondary | ICD-10-CM | POA: Diagnosis not present

## 2024-04-02 DIAGNOSIS — G300 Alzheimer's disease with early onset: Secondary | ICD-10-CM | POA: Diagnosis not present

## 2024-05-14 DIAGNOSIS — G300 Alzheimer's disease with early onset: Secondary | ICD-10-CM | POA: Diagnosis not present
